# Patient Record
Sex: Male | Born: 1955
Health system: Southern US, Community
[De-identification: ages and names within clinical notes are randomized; demographics above are authoritative.]

## PROBLEM LIST (undated history)

## (undated) DIAGNOSIS — N529 Male erectile dysfunction, unspecified: Secondary | ICD-10-CM

## (undated) DIAGNOSIS — E119 Type 2 diabetes mellitus without complications: Secondary | ICD-10-CM

## (undated) DIAGNOSIS — E785 Hyperlipidemia, unspecified: Secondary | ICD-10-CM

## (undated) DIAGNOSIS — B20 Human immunodeficiency virus [HIV] disease: Secondary | ICD-10-CM

## (undated) HISTORY — DX: Hyperlipidemia, unspecified: E78.5

## (undated) HISTORY — DX: Human immunodeficiency virus (HIV) disease: B20

## (undated) HISTORY — PX: HERNIA REPAIR: SHX51

## (undated) HISTORY — DX: Type 2 diabetes mellitus without complications: E11.9

## (undated) HISTORY — DX: Male erectile dysfunction, unspecified: N52.9

---

## 2014-12-05 ENCOUNTER — Encounter: Payer: Self-pay | Admitting: Family Medicine

## 2014-12-31 ENCOUNTER — Encounter: Payer: Self-pay | Admitting: Family Medicine

## 2014-12-31 LAB — GLUCOSE, POCT (MANUAL RESULT ENTRY): POC Glucose: 160 mg/dl — AB (ref 70–99)

## 2015-07-06 LAB — HM COLONOSCOPY

## 2016-02-29 LAB — LIPID PANEL
Cholesterol: 286 — AB (ref 0–200)
HDL: 33 — AB (ref 35–70)
LDL Cholesterol: 184
TRIGLYCERIDES: 347 — AB (ref 40–160)

## 2016-02-29 LAB — BASIC METABOLIC PANEL
Glucose: 136
Potassium: 5.3 (ref 3.4–5.3)

## 2016-02-29 LAB — CBC AND DIFFERENTIAL
HEMATOCRIT: 43 (ref 41–53)
HEMOGLOBIN: 13.6 (ref 13.5–17.5)

## 2016-02-29 LAB — PSA: PSA: 0.5

## 2016-02-29 LAB — HEMOGLOBIN A1C: HEMOGLOBIN A1C: 7

## 2017-08-02 ENCOUNTER — Encounter: Payer: Self-pay | Admitting: Family Medicine

## 2017-08-25 ENCOUNTER — Ambulatory Visit: Payer: BLUE CROSS/BLUE SHIELD | Admitting: Family Medicine

## 2017-08-25 ENCOUNTER — Encounter: Payer: Self-pay | Admitting: Family Medicine

## 2017-08-25 VITALS — BP 124/74 | HR 62 | Temp 98.1°F | Ht 65.5 in | Wt 172.0 lb

## 2017-08-25 DIAGNOSIS — Z7689 Persons encountering health services in other specified circumstances: Secondary | ICD-10-CM | POA: Diagnosis not present

## 2017-08-25 DIAGNOSIS — E1169 Type 2 diabetes mellitus with other specified complication: Secondary | ICD-10-CM

## 2017-08-25 DIAGNOSIS — R1031 Right lower quadrant pain: Secondary | ICD-10-CM | POA: Diagnosis not present

## 2017-08-25 DIAGNOSIS — E785 Hyperlipidemia, unspecified: Secondary | ICD-10-CM | POA: Diagnosis not present

## 2017-08-25 DIAGNOSIS — N529 Male erectile dysfunction, unspecified: Secondary | ICD-10-CM

## 2017-08-25 DIAGNOSIS — E119 Type 2 diabetes mellitus without complications: Secondary | ICD-10-CM

## 2017-08-25 DIAGNOSIS — Z8719 Personal history of other diseases of the digestive system: Secondary | ICD-10-CM | POA: Diagnosis not present

## 2017-08-25 DIAGNOSIS — Z9889 Other specified postprocedural states: Secondary | ICD-10-CM | POA: Diagnosis not present

## 2017-08-25 DIAGNOSIS — R6882 Decreased libido: Secondary | ICD-10-CM

## 2017-08-25 NOTE — Patient Instructions (Addendum)
It was a pleasure to meet you today! I look forward to partnering with you for your health care needs  Have your eye doctor send me the report  Consider getting some mild compression socks  Please see the referral coordinator on your way out and schedule a 6 month follow up  I will notify you of lab results next week

## 2017-08-25 NOTE — Progress Notes (Signed)
Subjective:    Patient ID: Clinton Watts, male    DOB: 04/14/1956, 62 y.o.   MRN: [id number]  HPI This is a 62 yo male who presents today to establish care. He lives with wife. Has grown children. 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 Enjoys travel.   He moved here from [place] in late 2018. Has history of diabetes on metformin. Checks his blood sugars most days, blood sugars running 90-120s. Never over 200.   Had hernia surgery last year right abdomen. Has occasional pain, a couple times a week. Sharp, doesn't last long. Avoids lifting anything heavy. Has noticed decreased libido. Has noticed difficulty with erection and orgasm. Denies any difficulties prior to his surgery. This has been very concerning for him.   Last CPE- 2016 PSA- in past, does not think it was ever elevated Colonoscopy- 2016- 5 year recall Tdap- unsure Flu- annually Dental- regularl Eye- has an appointment next week Exercise- walks, bicycles, works out at gym    Past Medical History:  Diagnosis Date  . Diabetes mellitus without complication (HCC)   . Hyperlipidemia     Family History  Problem Relation Age of Onset  . Diabetes Mother   . Stomach cancer Sister   . Pancreatic cancer Brother    Social History   Tobacco Use  . Smoking status: Never Smoker  . Smokeless tobacco: Never Used  Substance Use Topics  . Alcohol use: No    Frequency: Never  . Drug use: No      Review of Systems  Constitutional: Negative for fatigue and fever.  Respiratory: Negative for cough, chest tightness and shortness of breath.   Cardiovascular: Negative for chest pain, palpitations and leg swelling.  Gastrointestinal: Positive for abdominal pain (intermittent RLQ at site of hernia repair). Negative for blood in stool, constipation, diarrhea, nausea and vomiting.  Genitourinary: Negative for difficulty urinating, discharge, dysuria and frequency.       Decreased libido, difficulty obtaining and maintaining erection and  reaching orgasm.   Neurological: Negative for headaches.  Psychiatric/Behavioral: Negative for dysphoric mood and sleep disturbance. The patient is not nervous/anxious.        Objective:   Physical Exam Physical Exam  Constitutional: Oriented to person, place, and time. He appears well-developed and well-nourished.  HENT:  Head: Normocephalic and atraumatic.  Eyes: Conjunctivae are normal.  Neck: Normal range of motion. Neck supple.  Cardiovascular: Normal rate, regular rhythm and normal heart sounds.   Pulmonary/Chest: Effort normal and breath sounds normal.  Musculoskeletal: Normal range of motion.  Abdominal- well healed scar RLQ, no abnormality palpated, mild tenderness with palpation.  Neurological: Alert and oriented to person, place, and time.  Skin: Skin is warm and dry.  Psychiatric: Normal mood and affect. Behavior is normal. Judgment and thought content normal.  Vitals reviewed.     BP 124/74   Pulse 62   Temp 98.1 F (36.7 C) (Oral)   Ht 5' 5.5" (1.664 m)   Wt 172 lb (78 kg)   SpO2 98%   BMI 28.19 kg/m      Assessment & Plan:  1. Encounter to establish care - Discussed and encouraged healthy lifestyle choices- adequate sleep, regular exercise, stress management and healthy food choices.  - Obtained records release to request records from previous providers  2. History of hernia repair - Ambulatory referral to General Surgery  3. Right inguinal pain - Ambulatory referral to General Surgery  4. Type 2 diabetes mellitus without complication, without long-term current use of  insulin (HCC) - CBC with Differential - Hemoglobin A1c - Comprehensive metabolic panel - Lipid Panel - TSH  5. Decreased libido - Ambulatory referral to Urology - CBC with Differential - Hemoglobin A1c - Comprehensive metabolic panel - Lipid Panel - TSH  6. Erectile dysfunction, unspecified erectile dysfunction type - Ambulatory referral to Urology - CBC with  Differential - Hemoglobin A1c - Comprehensive metabolic panel - Lipid Panel - TSH  - follow up in 6 months  Olean Reeeborah Martice Doty, FNP-BC  [facility]  08/26/2017 2:16 PM

## 2017-08-26 ENCOUNTER — Encounter: Payer: Self-pay | Admitting: Family Medicine

## 2017-08-26 DIAGNOSIS — E785 Hyperlipidemia, unspecified: Secondary | ICD-10-CM

## 2017-08-26 DIAGNOSIS — Z9889 Other specified postprocedural states: Secondary | ICD-10-CM

## 2017-08-26 DIAGNOSIS — E1169 Type 2 diabetes mellitus with other specified complication: Secondary | ICD-10-CM | POA: Insufficient documentation

## 2017-08-26 DIAGNOSIS — Z8719 Personal history of other diseases of the digestive system: Secondary | ICD-10-CM | POA: Insufficient documentation

## 2017-08-26 DIAGNOSIS — E119 Type 2 diabetes mellitus without complications: Secondary | ICD-10-CM | POA: Insufficient documentation

## 2017-08-26 LAB — CBC WITH DIFFERENTIAL/PLATELET
Basophils Absolute: 38 cells/uL (ref 0–200)
Basophils Relative: 0.5 %
EOS ABS: 188 {cells}/uL (ref 15–500)
Eosinophils Relative: 2.5 %
HCT: 38.3 % — ABNORMAL LOW (ref 38.5–50.0)
Hemoglobin: 12.3 g/dL — ABNORMAL LOW (ref 13.2–17.1)
Lymphs Abs: 2408 cells/uL (ref 850–3900)
MCH: 20.8 pg — AB (ref 27.0–33.0)
MCHC: 32.1 g/dL (ref 32.0–36.0)
MCV: 64.7 fL — AB (ref 80.0–100.0)
MONOS PCT: 7.3 %
MPV: 10.9 fL (ref 7.5–12.5)
NEUTROS ABS: 4320 {cells}/uL (ref 1500–7800)
NEUTROS PCT: 57.6 %
PLATELETS: 190 10*3/uL (ref 140–400)
RBC: 5.92 10*6/uL — ABNORMAL HIGH (ref 4.20–5.80)
RDW: 16.7 % — ABNORMAL HIGH (ref 11.0–15.0)
TOTAL LYMPHOCYTE: 32.1 %
WBC mixed population: 548 cells/uL (ref 200–950)
WBC: 7.5 10*3/uL (ref 3.8–10.8)

## 2017-08-26 LAB — COMPREHENSIVE METABOLIC PANEL
AG RATIO: 1.5 (calc) (ref 1.0–2.5)
ALT: 13 U/L (ref 9–46)
AST: 23 U/L (ref 10–35)
Albumin: 4.5 g/dL (ref 3.6–5.1)
Alkaline phosphatase (APISO): 71 U/L (ref 40–115)
BUN: 19 mg/dL (ref 7–25)
CO2: 28 mmol/L (ref 20–32)
Calcium: 9.4 mg/dL (ref 8.6–10.3)
Chloride: 102 mmol/L (ref 98–110)
Creat: 0.91 mg/dL (ref 0.70–1.25)
GLOBULIN: 3.1 g/dL (ref 1.9–3.7)
GLUCOSE: 101 mg/dL — AB (ref 65–99)
Potassium: 4.2 mmol/L (ref 3.5–5.3)
SODIUM: 139 mmol/L (ref 135–146)
TOTAL PROTEIN: 7.6 g/dL (ref 6.1–8.1)
Total Bilirubin: 0.4 mg/dL (ref 0.2–1.2)

## 2017-08-26 LAB — LIPID PANEL
Cholesterol: 193 mg/dL (ref <200)
HDL: 28 mg/dL — ABNORMAL LOW (ref >40)
LDL Cholesterol (Calc): 129 mg/dL (calc) — ABNORMAL HIGH
NON-HDL CHOLESTEROL (CALC): 165 mg/dL — AB (ref <130)
Total CHOL/HDL Ratio: 6.9 (calc) — ABNORMAL HIGH (ref <5.0)
Triglycerides: 221 mg/dL — ABNORMAL HIGH (ref <150)

## 2017-08-26 LAB — TSH: TSH: 1.4 mIU/L (ref 0.40–4.50)

## 2017-08-26 LAB — HEMOGLOBIN A1C
Hgb A1c MFr Bld: 6.8 % of total Hgb — ABNORMAL HIGH (ref <5.7)
Mean Plasma Glucose: 148 (calc)
eAG (mmol/L): 8.2 (calc)

## 2017-08-31 ENCOUNTER — Telehealth: Payer: Self-pay | Admitting: Family Medicine

## 2017-08-31 NOTE — Telephone Encounter (Signed)
Copied from CRM [id number]. Topic: Quick Communication - See Telephone Encounter >> Aug 31, 2017  4:20 PM Rudi CocoLathan, Natale Thoma M, [place] wrote: CRM for notification. See Telephone encounter for:   08/31/17. Pt. Calling to get lab results from 08-25-17. Pt. Can be reached at [phone]

## 2017-09-01 NOTE — Telephone Encounter (Signed)
Copied from CRM [id number]. Topic: Quick Communication - See Telephone Encounter >> Aug 31, 2017  4:20 PM Rudi CocoLathan, Latoya M, [place] wrote: CRM for notification. See Telephone encounter for:   08/31/17. Pt. Calling to get lab results from 08-25-17. Pt. Can be reached at [phone] >> Sep 01, 2017  3:00 PM Elliot GaultBell, Tiffany M wrote: Relation to pt: self  Call back number: [phone]    Reason for call:  Patient states he missed a call regarding lab results, chart doesn't reflect, please advise

## 2017-09-01 NOTE — Telephone Encounter (Signed)
Called patient to discuss labs.

## 2017-09-04 ENCOUNTER — Encounter: Payer: Self-pay | Admitting: Family Medicine

## 2017-09-04 MED ORDER — ROSUVASTATIN CALCIUM 20 MG PO TABS: 20.0000 mg | ORAL_TABLET | Freq: Every day | ORAL | 1 refills | Status: DC

## 2017-09-18 ENCOUNTER — Telehealth: Payer: Self-pay | Admitting: Family Medicine

## 2017-09-18 NOTE — Telephone Encounter (Signed)
Spoke to pt who states he received colonoscopy at the [facility] in 2016 and believes me may have his results. He will search for them and bring them to office, if so. Pt also states he will be out of town the majority of April and will contact office to schedule f/u appt upon his return

## 2017-09-18 NOTE — Telephone Encounter (Signed)
Please call patient and schedule follow up visit for end of April to recheck labs. I have received some outside records, but they do not include colonoscopy report. Please see if patient remembers where he had procedure and obtain records release.

## 2017-09-19 ENCOUNTER — Telehealth: Payer: Self-pay | Admitting: Family Medicine

## 2017-09-19 NOTE — Telephone Encounter (Signed)
Copied from CRM [id number]. Topic: Quick Communication - See Telephone Encounter >> Sep 19, 2017  3:07 PM Windy KalataMichael, Kendarious Gudino L, NT wrote: CRM for notification. See Telephone encounter for:  09/19/17.  Patient is calling and states [facility] him and states the office needs to contact them in regards to rosuvastatin (CRESTOR) 20 MG tablet before he can get it. Please advise.  CB# [phone]  Reference # [id number]

## 2017-09-19 NOTE — Telephone Encounter (Addendum)
Called [facility] spoke with Raquel pharm tech with ref # [id number]; Needs to verify duplication of pravastatin 40 mg refilled 08/31/17 and now Rosuvastatin 20 mg requested to be filled at [facility]. Pravastatin,simvastatin and rosuvastatin are on current med list. Please see 09/04/17 letter advising pt to stop Atorvastatin which is not on current or hx med list and Simvastatin and start Rosuvastatin. Please advise and then need cb to [facility] with above ref # [id number].

## 2017-09-20 ENCOUNTER — Encounter: Payer: Self-pay | Admitting: Urology

## 2017-09-20 ENCOUNTER — Ambulatory Visit: Payer: BLUE CROSS/BLUE SHIELD | Admitting: Urology

## 2017-09-20 VITALS — BP 171/75 | HR 82 | Ht 65.5 in | Wt 174.4 lb

## 2017-09-20 DIAGNOSIS — N400 Enlarged prostate without lower urinary tract symptoms: Secondary | ICD-10-CM

## 2017-09-20 DIAGNOSIS — N529 Male erectile dysfunction, unspecified: Secondary | ICD-10-CM

## 2017-09-20 DIAGNOSIS — R6882 Decreased libido: Secondary | ICD-10-CM | POA: Diagnosis not present

## 2017-09-20 MED ORDER — SILDENAFIL CITRATE 20 MG PO TABS: ORAL_TABLET | ORAL | 0 refills | Status: DC

## 2017-09-20 NOTE — Telephone Encounter (Signed)
That is correct, he is to stop pravastatin and simvastatin and start rosuvastatin 20 mg. Please call pharmacy to clarify.

## 2017-09-20 NOTE — Progress Notes (Signed)
09/20/2017 11:12 AM   Clinton RichardsNoel Herrig 1956-02-19 [id number]  Referring provider: Emi BelfastGessner, Deborah B, FNP [address] [place], [place] [id number]  Chief Complaint  Patient presents with  . Erectile Dysfunction    HPI: Clinton Watts is a 62 year old male seen in consultation at the request of Olean ReeDeborah Gessner, FNP for evaluation of low libido and erectile dysfunction.  He states after hernia surgery performed in 2017 he had onset of significantly decreased libido and difficulty maintaining an erection.  He typically can get an erection that is firm enough for penetration however he will lose the erection within minutes and prior to ejaculation.  He was having no problems prior to his surgery.  He does have chronic right groin discomfort since the repair and has an appointment pending with general surgery.  He has no bothersome lower urinary tract symptoms.  Denies dysuria or gross hematuria.  Denies flank, abdominal, pelvic or scrotal pain.  There is no pain or curvature with erections.  He denies tiredness or fatigue.   PMH: Past Medical History:  Diagnosis Date  . Diabetes mellitus without complication (HCC)   . Hyperlipidemia     Surgical History: Past Surgical History:  Procedure Laterality Date  . HERNIA REPAIR      Home Medications:  Allergies as of 09/20/2017   No Known Allergies     Medication List        Accurate as of 09/20/17 11:12 AM. Always use your most recent med list.          lisinopril 10 MG tablet Commonly known as:  PRINIVIL,ZESTRIL Take 10 mg by mouth daily.   metFORMIN 500 MG tablet Commonly known as:  GLUCOPHAGE Take 500 mg by mouth 2 (two) times daily with a meal.   rosuvastatin 20 MG tablet Commonly known as:  CRESTOR Take 20 mg by mouth daily.       Allergies: No Known Allergies  Family History: Family History  Problem Relation Age of Onset  . Diabetes Mother   . Stomach cancer Sister   . Pancreatic cancer Brother   . Prostate  cancer Neg Hx   . Bladder Cancer Neg Hx   . Kidney cancer Neg Hx     Social History:  reports that  has never smoked. he has never used smokeless tobacco. He reports that he does not drink alcohol or use drugs.  ROS: UROLOGY Frequent Urination?: Yes Hard to postpone urination?: No Burning/pain with urination?: No Get up at night to urinate?: Yes Leakage of urine?: No Urine stream starts and stops?: No Trouble starting stream?: No Do you have to strain to urinate?: No Blood in urine?: No Urinary tract infection?: No Sexually transmitted disease?: No Injury to kidneys or bladder?: No Painful intercourse?: No Weak stream?: Yes Erection problems?: Yes Penile pain?: Yes  Gastrointestinal Nausea?: No Vomiting?: No Indigestion/heartburn?: No Diarrhea?: No Constipation?: No  Constitutional Fever: No Night sweats?: No Weight loss?: No Fatigue?: No  Skin Skin rash/lesions?: No Itching?: No  Eyes Blurred vision?: No Double vision?: No  Ears/Nose/Throat Sore throat?: No Sinus problems?: Yes  Hematologic/Lymphatic Swollen glands?: No Easy bruising?: No  Cardiovascular Leg swelling?: No Chest pain?: No  Respiratory Cough?: No Shortness of breath?: No  Endocrine Excessive thirst?: No  Musculoskeletal Back pain?: No Joint pain?: No  Neurological Headaches?: No Dizziness?: No  Psychologic Depression?: No Anxiety?: No  Physical Exam: BP (!) 171/75 (BP Location: Right Arm, Patient Position: Sitting, Cuff Size: Normal)   Pulse 82  Ht 5' 5.5" (1.664 m)   Wt 174 lb 6.4 oz (79.1 kg)   BMI 28.58 kg/m   Constitutional:  Alert and oriented, No acute distress. HEENT: [place] AT, moist mucus membranes.  Trachea midline, no masses. Cardiovascular: No clubbing, cyanosis, or edema. Respiratory: Normal respiratory effort, no increased work of breathing. GI: Abdomen is soft, nontender, nondistended, no abdominal masses GU: No CVA tenderness.  Penis circumcised  without lesions.  No palpable plaques.  Testes descended bilaterally without masses or tenderness with estimated volume of 15 cc.  Prostate 40 g, smooth without nodules. Lymph: No cervical or inguinal lymphadenopathy. Skin: No rashes, bruises or suspicious lesions. Neurologic: Grossly intact, no focal deficits, moving all 4 extremities. Psychiatric: Normal mood and affect.  Laboratory Data: Lab Results  Component Value Date   WBC 7.5 08/25/2017   HGB 12.3 (L) 08/25/2017   HCT 38.3 (L) 08/25/2017   MCV 64.7 (L) 08/25/2017   PLT 190 08/25/2017    Lab Results  Component Value Date   CREATININE 0.91 08/25/2017    Lab Results  Component Value Date   HGBA1C 6.8 (H) 08/25/2017     Assessment & Plan:   62 year old male with low libido and erectile dysfunction.  He was informed that his hernia surgery would not have been a direct cause of this problem as there are no nerves in this area that would relate to erections.  It is possible this may have been and directly related to the problem with postoperative pain and chronic groin pain.  I recommended an a.m. testosterone level, LH and PSA. If low he will need a repeat testosterone level.  He was interested in a trial of generic sildenafil and an Rx was sent.   [name], MD  [facility] [address], [address] [place], [place] [address] [phone]

## 2017-09-21 ENCOUNTER — Encounter: Payer: Self-pay | Admitting: Family Medicine

## 2017-09-21 NOTE — Telephone Encounter (Signed)
Patient calling because he still has not received rosuvastatin and he says [facility] told him they are open 9 to 5. He is concerned because he has already stopped taking the other two medications, aforementioned. Patient would like a call back to discuss.

## 2017-09-21 NOTE — Telephone Encounter (Signed)
Attempted to [facility]; currently closed. Will attempt again later

## 2017-09-22 NOTE — Telephone Encounter (Signed)
Spoke to pharmacist, Raeann and advised per Ball CorporationDGessner. Also spoke to pt and advised corrections have been made. Pt is going to contact pharmacy and request medication be sent overnight, as Raeann stated the request had to come from the pt since there is an additional charge. [facility] and notify office back if he is needing meds sent to local pharmacy

## 2017-09-26 DIAGNOSIS — E119 Type 2 diabetes mellitus without complications: Secondary | ICD-10-CM | POA: Diagnosis not present

## 2017-09-27 ENCOUNTER — Other Ambulatory Visit: Payer: Self-pay | Admitting: *Deleted

## 2017-09-27 ENCOUNTER — Other Ambulatory Visit (HOSPITAL_COMMUNITY)
Admission: RE | Admit: 2017-09-27 | Discharge: 2017-09-27 | Disposition: A | Discharge status: Home / Self Care | Payer: BLUE CROSS/BLUE SHIELD | Source: Ambulatory Visit | Attending: Infectious Diseases | Admitting: Infectious Diseases

## 2017-09-27 ENCOUNTER — Other Ambulatory Visit: Payer: BLUE CROSS/BLUE SHIELD

## 2017-09-27 ENCOUNTER — Ambulatory Visit: Payer: BLUE CROSS/BLUE SHIELD

## 2017-09-27 DIAGNOSIS — Z113 Encounter for screening for infections with a predominantly sexual mode of transmission: Secondary | ICD-10-CM

## 2017-09-27 DIAGNOSIS — Z79899 Other long term (current) drug therapy: Secondary | ICD-10-CM

## 2017-09-27 DIAGNOSIS — B20 Human immunodeficiency virus [HIV] disease: Secondary | ICD-10-CM | POA: Diagnosis not present

## 2017-09-27 LAB — HM DIABETES EYE EXAM

## 2017-09-28 LAB — URINE CYTOLOGY ANCILLARY ONLY
Chlamydia: NEGATIVE
Neisseria Gonorrhea: NEGATIVE

## 2017-09-28 LAB — URINALYSIS
BILIRUBIN URINE: NEGATIVE
GLUCOSE, UA: NEGATIVE
Hgb urine dipstick: NEGATIVE
KETONES UR: NEGATIVE
Leukocytes, UA: NEGATIVE
Nitrite: NEGATIVE
PH: 6 (ref 5.0–8.0)
Protein, ur: NEGATIVE
SPECIFIC GRAVITY, URINE: 1.011 (ref 1.001–1.03)

## 2017-09-29 LAB — QUANTIFERON-TB GOLD PLUS
Mitogen-NIL: >10 IU/mL
NIL: 0.14 [IU]/mL
QuantiFERON-TB Gold Plus: NEGATIVE
TB1-NIL: 0.02 IU/mL
TB2-NIL: 0.01 [IU]/mL

## 2017-09-29 LAB — T-HELPER CELL (CD4) - (RCID CLINIC ONLY)
CD4 T CELL ABS: 450 /uL (ref 400–2700)
CD4 T CELL HELPER: 20 % — AB (ref 33–55)

## 2017-09-30 LAB — COMPLETE METABOLIC PANEL WITH GFR
AG RATIO: 1.6 (calc) (ref 1.0–2.5)
ALBUMIN MSPROF: 4.7 g/dL (ref 3.6–5.1)
ALT: 15 U/L (ref 9–46)
AST: 25 U/L (ref 10–35)
Alkaline phosphatase (APISO): 73 U/L (ref 40–115)
BILIRUBIN TOTAL: 0.3 mg/dL (ref 0.2–1.2)
BUN: 17 mg/dL (ref 7–25)
CHLORIDE: 101 mmol/L (ref 98–110)
CO2: 29 mmol/L (ref 20–32)
Calcium: 9.7 mg/dL (ref 8.6–10.3)
Creat: 1 mg/dL (ref 0.70–1.25)
GFR, EST AFRICAN AMERICAN: 92 mL/min/{1.73_m2} (ref >=60)
GFR, Est Non African American: 80 mL/min/{1.73_m2} (ref >=60)
Globulin: 2.9 g/dL (calc) (ref 1.9–3.7)
Glucose, Bld: 134 mg/dL — ABNORMAL HIGH (ref 65–99)
POTASSIUM: 4.3 mmol/L (ref 3.5–5.3)
Sodium: 141 mmol/L (ref 135–146)
TOTAL PROTEIN: 7.6 g/dL (ref 6.1–8.1)

## 2017-09-30 LAB — CBC WITH DIFFERENTIAL/PLATELET
BASOS PCT: 0.4 %
Basophils Absolute: 30 cells/uL (ref 0–200)
Eosinophils Absolute: 167 cells/uL (ref 15–500)
Eosinophils Relative: 2.2 %
HEMATOCRIT: 38.4 % — AB (ref 38.5–50.0)
HEMOGLOBIN: 12.5 g/dL — AB (ref 13.2–17.1)
LYMPHS ABS: 2280 {cells}/uL (ref 850–3900)
MCH: 21 pg — ABNORMAL LOW (ref 27.0–33.0)
MCHC: 32.6 g/dL (ref 32.0–36.0)
MCV: 64.4 fL — ABNORMAL LOW (ref 80.0–100.0)
MPV: 10.5 fL (ref 7.5–12.5)
Monocytes Relative: 5.9 %
NEUTROS PCT: 61.5 %
Neutro Abs: 4674 cells/uL (ref 1500–7800)
Platelets: 195 10*3/uL (ref 140–400)
RBC: 5.96 10*6/uL — AB (ref 4.20–5.80)
RDW: 16.4 % — AB (ref 11.0–15.0)
TOTAL LYMPHOCYTE: 30 %
WBC: 7.6 10*3/uL (ref 3.8–10.8)
WBCMIX: 448 {cells}/uL (ref 200–950)

## 2017-09-30 LAB — LIPID PANEL
CHOLESTEROL: 211 mg/dL — AB (ref <200)
HDL: 27 mg/dL — ABNORMAL LOW (ref >40)
LDL CHOLESTEROL (CALC): 135 mg/dL — AB
Non-HDL Cholesterol (Calc): 184 mg/dL (calc) — ABNORMAL HIGH (ref <130)
TRIGLYCERIDES: 344 mg/dL — AB (ref <150)
Total CHOL/HDL Ratio: 7.8 (calc) — ABNORMAL HIGH (ref <5.0)

## 2017-09-30 LAB — RPR: RPR: NONREACTIVE

## 2017-09-30 LAB — HEPATITIS A ANTIBODY, TOTAL: Hepatitis A AB,Total: REACTIVE — AB

## 2017-09-30 LAB — HEPATITIS B CORE ANTIBODY, TOTAL: Hep B Core Total Ab: NONREACTIVE

## 2017-09-30 LAB — HEPATITIS C ANTIBODY
Hepatitis C Ab: NONREACTIVE
SIGNAL TO CUT-OFF: 0.37 (ref <1.00)

## 2017-09-30 LAB — HLA B*5701: HLA-B*5701 w/rflx HLA-B High: NEGATIVE

## 2017-09-30 LAB — HEPATITIS B SURFACE ANTIBODY,QUALITATIVE: Hep B S Ab: REACTIVE — AB

## 2017-09-30 LAB — HIV-1 RNA ULTRAQUANT REFLEX TO GENTYP+
HIV 1 RNA Quant: <20 Copies/mL
HIV-1 RNA Quant, Log: <1.3 Log cps/mL

## 2017-09-30 LAB — HEPATITIS B SURFACE ANTIGEN: Hepatitis B Surface Ag: NONREACTIVE

## 2017-10-12 ENCOUNTER — Ambulatory Visit (INDEPENDENT_AMBULATORY_CARE_PROVIDER_SITE_OTHER): Payer: BLUE CROSS/BLUE SHIELD | Admitting: Infectious Diseases

## 2017-10-12 ENCOUNTER — Ambulatory Visit (INDEPENDENT_AMBULATORY_CARE_PROVIDER_SITE_OTHER): Payer: BLUE CROSS/BLUE SHIELD | Admitting: Pharmacist

## 2017-10-12 ENCOUNTER — Encounter: Payer: Self-pay | Admitting: Infectious Diseases

## 2017-10-12 ENCOUNTER — Encounter: Payer: Self-pay | Admitting: Family Medicine

## 2017-10-12 VITALS — BP 150/71 | HR 75 | Temp 98.0°F | Ht 67.0 in | Wt 175.0 lb

## 2017-10-12 DIAGNOSIS — B20 Human immunodeficiency virus [HIV] disease: Secondary | ICD-10-CM | POA: Diagnosis not present

## 2017-10-12 DIAGNOSIS — E1169 Type 2 diabetes mellitus with other specified complication: Secondary | ICD-10-CM

## 2017-10-12 DIAGNOSIS — Z21 Asymptomatic human immunodeficiency virus [HIV] infection status: Secondary | ICD-10-CM | POA: Diagnosis not present

## 2017-10-12 DIAGNOSIS — E785 Hyperlipidemia, unspecified: Secondary | ICD-10-CM

## 2017-10-12 DIAGNOSIS — Z0000 Encounter for general adult medical examination without abnormal findings: Secondary | ICD-10-CM

## 2017-10-12 DIAGNOSIS — N529 Male erectile dysfunction, unspecified: Secondary | ICD-10-CM | POA: Diagnosis not present

## 2017-10-12 HISTORY — DX: Human immunodeficiency virus (HIV) disease: B20

## 2017-10-12 HISTORY — DX: Asymptomatic human immunodeficiency virus (hiv) infection status: Z21

## 2017-10-12 MED ORDER — BICTEGRAVIR-EMTRICITAB-TENOFOV 50-200-25 MG PO TABS: 1.0000 | ORAL_TABLET | Freq: Every day | ORAL | 1 refills | Status: DC

## 2017-10-12 MED ORDER — BICTEGRAVIR-EMTRICITAB-TENOFOV 50-200-25 MG PO TABS: 1.0000 | ORAL_TABLET | Freq: Every day | ORAL | 6 refills | Status: DC

## 2017-10-12 NOTE — Assessment & Plan Note (Signed)
Agree with continued aggressive statin treatment to lower CV risk in this hypertensive, diabetic HIV(+) patient.

## 2017-10-12 NOTE — Assessment & Plan Note (Signed)
Up to date on recommended vaccines. Booster with pneumovax after 62 yo.

## 2017-10-12 NOTE — Patient Instructions (Addendum)
Stop Atripla when you are done with your last bottle.   Biktarvy is the pill for your HIV virus - this will need to be taken once a day around the same time.  - Common side effects for a short time frame usually include headaches, nausea and diarrhea - OK to take over the counter tylenol for headaches and imodium for diarrhea - Try taking with food if you are nauseated  - Separate all vitamins, antacids and iron supplements by this pill by 6 hours before or after.   Will have you back in 3 months to see Judeth CornfieldStephanie again.

## 2017-10-12 NOTE — Assessment & Plan Note (Signed)
Drug induced vs low testosterone vs metabolic disease?  His PCP is planning on checking testosterone.  Have advised to trial the sildenafil Debbie gave him to see if he gets good effect from this. No interaction concern with his HIV medications.

## 2017-10-12 NOTE — Progress Notes (Signed)
[facility] Visit  HPI: Clinton Watts is a 62 y.o. male who presents to the [facility] clinic today [place] [facility]  Patient Active Problem List   Diagnosis Date Noted  . HIV (human immunodeficiency virus infection) (HCC) 10/12/2017  . History of hernia repair 08/26/2017  . Type 2 diabetes mellitus without complication, without long-term current use of insulin (HCC) 08/26/2017  . Hyperlipidemia associated with type 2 diabetes mellitus (HCC) 08/26/2017    Patient's Medications  New Prescriptions   BICTEGRAVIR-EMTRICITABINE-TENOFOVIR AF (BIKTARVY) 50-200-25 MG TABS TABLET    Take 1 tablet by mouth daily.  Previous Medications   LISINOPRIL (PRINIVIL,ZESTRIL) 10 MG TABLET    Take 10 mg by mouth daily.   METFORMIN (GLUCOPHAGE) 500 MG TABLET    Take 500 mg by mouth 2 (two) times daily with a meal.   ROSUVASTATIN (CRESTOR) 20 MG TABLET    Take 20 mg by mouth daily.   SILDENAFIL (REVATIO) 20 MG TABLET    2-5 tabs 1 hour prior to intercourse  Modified Medications   No medications on file  Discontinued Medications   EFAVIRENZ-EMTRICITABINE-TENOFOVIR (ATRIPLA) 600-200-300 MG TABLET    Take 1 tablet by mouth at bedtime.    Allergies: No Known Allergies  Past Medical History: Past Medical History:  Diagnosis Date  . Diabetes mellitus without complication (HCC)   . HIV (human immunodeficiency virus infection) (HCC) 10/12/2017  . Hyperlipidemia     Social History: Social History   Socioeconomic History  . Marital status: Married    Spouse name: Not on file  . Number of children: Not on file  . Years of education: Not on file  . Highest education level: Not on file  Occupational History  . Not on file  Social Needs  . Financial resource strain: Not on file  . Food insecurity:    Worry: Not on file    Inability: Not on file  . Transportation needs:    Medical: Not on file   Non-medical: Not on file  Tobacco Use  . Smoking status: Never Smoker  . Smokeless tobacco: Never Used  Substance and Sexual Activity  . Alcohol use: No    Frequency: Never  . Drug use: No  . Sexual activity: Yes  Lifestyle  . Physical activity:    Days per week: Not on file    Minutes per session: Not on file  . Stress: Not on file  Relationships  . Social connections:    Talks on phone: Not on file    Gets together: Not on file    Attends religious service: Not on file    Active member of club or organization: Not on file    Attends meetings of clubs or organizations: Not on file    Relationship status: Not on file  Other Topics Concern  . Not on file  Social History Narrative   Patient lives with his wife. Has 2 grown children, several grandchildren.    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     Labs: HIV 1 RNA Quant (Copies/mL)  Date Value  09/27/2017 <20   CD4 T Cell Abs (/uL)  Date Value  09/27/2017 450   Hep B S Ab (no units)  Date Value  09/27/2017 REACTIVE (A)   Hepatitis B Surface Ag (no units)  Date Value  09/27/2017 NON-REACTIVE    Lipids:    Component Value Date/Time  CHOL 211 (H) 09/27/2017 1443   TRIG 344 (H) 09/27/2017 1443   HDL 27 (L) 09/27/2017 1443   CHOLHDL 7.8 (H) 09/27/2017 1443   LDLCALC 135 (H) 09/27/2017 1443    Current HIV Regimen: Atripla  Assessment: Berl is here to initiate care with Judeth Cornfield for his HIV infection.  He recently moved here from [place] to be closer to family.  He is doing well on Atripla with an undetectable viral load and no issues.  Judeth Cornfield discussed the issues surrounding Atripla and TDF-based regimens and he is willing to switch.  I discussed Biktarvy with him today. I showed him the different medications in Biktarvy and the difference in the TDF-based regimen vs TAF-based ones. Also explained how to take it and that he could continue taking it before bedtime if he would like.  Discussed possible  side effects such as headaches and nausea with him.  He is willing to try.  [facility] He requested a 90 day fill, so that is what I sent in.  I will have him f/u in ~4 weeks.  He is going to visit family in [place] next month so he will call me to schedule a f/u appointment. Gave him a new co-pay card (he had an old one) and my card, as well, and told him to call me with any issues.  Plan: - Stop Atripla - Start Biktarvy PO once daily - F/u with me again in 4-6 weeks  Patrese Neal L. Rasheeda Mulvehill, PharmD, AAHIVP, CPP Infectious Diseases Clinical Pharmacist [facility] 10/12/2017, 3:04 PM

## 2017-10-12 NOTE — Progress Notes (Signed)
Name: Clinton Watts  DOB: 10/31/1955 MRN: [id number] PCP: Emi BelfastGessner, Deborah B, FNP   Patient Active Problem List   Diagnosis Date Noted  . HIV (human immunodeficiency virus infection) (HCC) 10/12/2017  . Healthcare maintenance 10/12/2017  . Erectile dysfunction 10/12/2017  . History of hernia repair 08/26/2017  . Type 2 diabetes mellitus without complication, without long-term current use of insulin (HCC) 08/26/2017  . Hyperlipidemia associated with type 2 diabetes mellitus (HCC) 08/26/2017     Brief Narrative:  Clinton Watts is a 62 y.o. AA male with HIV infection. Originally diagnosed with HIV in 1992. [place]. HIV Risk: heterosexual contact. [facility] [place] [phone]). Hx OIs: none known   Previous Regimens:  Combivir + Crixivan . Atripla --> well controlled and undetectable for years  Genotypes: . None on file or from previous provider   Subjective:  Clinton Watts is here today to establish his HIV care. He is currently taking Atripla and has tolerated this well for many years and has no concerns over medication side effect. He has been told in the past it may be best to switch his ART but he is not certain as to why. Previous VL's have been undetectable and CD4 with full immune reconstitution 500 - 650 range. Reports no complaints today suggestive of associated opportunistic infection or advancing HIV disease such as fevers, night sweats, weight loss, anorexia, cough, SOB, nausea, vomiting, diarrhea, headache, sensory changes, lymphadenopathy or oral thrush.    Other chronic medical conditions include type 2 diabetes for which he takes metformin; hypertension for which he takes lisinopril; hyperlipidemia for which he takes crestor. He has a history of chronic anemia that he tells me he was previously recommended iron supplement but moved before prescription was given.   He receives routine preventative care and chronic disease management  through his PCP. He is up to date on all recommended vaccines and screenings. Colonoscopy 2016 (normal). Only concern today is erectile dysfunction which is a new problem for him. Has been rx'd   Review of Systems  Constitutional: Negative for chills, fever, malaise/fatigue and weight loss.  HENT: Negative for sore throat.        No dental problems  Respiratory: Negative for cough and sputum production.   Cardiovascular: Negative for chest pain and leg swelling.  Gastrointestinal: Negative for abdominal pain, diarrhea and vomiting.  Genitourinary: Negative for dysuria and flank pain.       ED  Musculoskeletal: Negative for joint pain, myalgias and neck pain.  Skin: Negative for rash.  Neurological: Negative for dizziness, tingling and headaches.  Psychiatric/Behavioral: Negative for depression and substance abuse. The patient is not nervous/anxious and does not have insomnia.     Past Medical History:  Diagnosis Date  . Diabetes mellitus without complication (HCC)   . HIV (human immunodeficiency virus infection) (HCC) 10/12/2017  . Hyperlipidemia     Outpatient Medications Prior to Visit  Medication Sig Dispense Refill  . metFORMIN (GLUCOPHAGE) 500 MG tablet Take 500 mg by mouth 2 (two) times daily with a meal.    . sildenafil (REVATIO) 20 MG tablet 2-5 tabs 1 hour prior to intercourse 30 tablet 0  . efavirenz-emtricitabine-tenofovir (ATRIPLA) 600-200-300 MG tablet Take 1 tablet by mouth at bedtime.    Marland Kitchen. lisinopril (PRINIVIL,ZESTRIL) 10 MG tablet Take 10 mg by mouth daily.    . rosuvastatin (CRESTOR) 20 MG tablet Take 20 mg by mouth daily.     No facility-administered medications prior to  visit.      No Known Allergies  Social History   Tobacco Use  . Smoking status: Never Smoker  . Smokeless tobacco: Never Used  Substance Use Topics  . Alcohol use: No    Frequency: Never  . Drug use: No    Family History  Problem Relation Age of Onset  . Diabetes Mother   . Stomach  cancer Sister   . Pancreatic cancer Brother   . Prostate cancer Neg Hx   . Bladder Cancer Neg Hx   . Kidney cancer Neg Hx     Social History   Substance and Sexual Activity  Sexual Activity Yes     Objective:   Vitals:   10/12/17 1340  BP: (!) 150/71  Pulse: 75  Temp: 98 F (36.7 C)  TempSrc: Oral  Weight: 175 lb (79.4 kg)  Height: 5\' 7"  (1.702 m)   Body mass index is 27.41 kg/m.  Physical Exam  Constitutional: He is oriented to person, place, and time. Vital signs are normal. He appears well-developed and well-nourished.  HENT:  Mouth/Throat: No oral lesions. Normal dentition. No dental caries.  Eyes: No scleral icterus.  Cardiovascular: Normal rate, regular rhythm and normal heart sounds.  Pulmonary/Chest: Effort normal and breath sounds normal.  Abdominal: Soft. He exhibits no distension. There is no tenderness.  Lymphadenopathy:    He has no cervical adenopathy.  Neurological: He is alert and oriented to person, place, and time.  Skin: Skin is warm and dry. No rash noted.  Psychiatric: He has a normal mood and affect. His behavior is normal. Judgment and thought content normal.  Vitals reviewed.   Lab Results Lab Results  Component Value Date   WBC 7.6 09/27/2017   HGB 12.5 (L) 09/27/2017   HCT 38.4 (L) 09/27/2017   MCV 64.4 (L) 09/27/2017   PLT 195 09/27/2017    Lab Results  Component Value Date   CREATININE 1.00 09/27/2017   BUN 17 09/27/2017   NA 141 09/27/2017   K 4.3 09/27/2017   CL 101 09/27/2017   CO2 29 09/27/2017    Lab Results  Component Value Date   ALT 15 09/27/2017   AST 25 09/27/2017   BILITOT 0.3 09/27/2017    Lab Results  Component Value Date   CHOL 211 (H) 09/27/2017   HDL 27 (L) 09/27/2017   LDLCALC 135 (H) 09/27/2017   TRIG 344 (H) 09/27/2017   CHOLHDL 7.8 (H) 09/27/2017   HIV 1 RNA Quant (Copies/mL)  Date Value  09/27/2017 <20   CD4 T Cell Abs (/uL)  Date Value  09/27/2017 450   Lab Results  Component Value  Date   HGBA1C 6.8 (H) 08/25/2017    Assessment & Plan:   Problem List Items Addressed This Visit      Endocrine   Hyperlipidemia associated with type 2 diabetes mellitus (HCC)    Agree with continued aggressive statin treatment to lower CV risk in this hypertensive, diabetic HIV(+) patient.         Genitourinary   Erectile dysfunction    Drug induced vs low testosterone vs metabolic disease?  His PCP is planning on checking testosterone.  Have advised to trial the sildenafil Debbie gave him to see if he gets good effect from this. No interaction concern with his HIV medications.         Other   HIV (human immunodeficiency virus infection) (HCC)    Has been well controlled on Atripla with serial undetectable viral loads.  No genotype included with records but from our discussion it seems he has never been on integrase based regimen. We discussed the benefit of switching to newer/safer agent today for kidney and bone protection tenofovir AF offers vs. tenofovir DF. He would like to consider this change but finish his present supply of Atripla. I had him meet with our pharmacist Cassie today and provided with copay assist. I discussed with him the link with working with his PCP to keep good control over diabetes, high blood pressure and high cholesterol to reduce CV risk considering his HIV is a big risk factor for these events.   He will return to see me in 3 months or sooner if needed.       Healthcare maintenance    Up to date on recommended vaccines. Booster with pneumovax after 62 yo.         He will return in 3 months to see me.   Rexene Alberts, MSN, NP-C [facility] Pager: [phone] Office: [phone]  10/12/17  3:37 PM

## 2017-10-12 NOTE — Assessment & Plan Note (Addendum)
Has been well controlled on Atripla with serial undetectable viral loads. No genotype included with records but from our discussion it seems he has never been on integrase based regimen. We discussed the benefit of switching to newer/safer agent today for kidney and bone protection tenofovir AF offers vs. tenofovir DF. He would like to consider this change but finish his present supply of Atripla. I had him meet with our pharmacist Cassie today and provided with copay assist. I discussed with him the link with working with his PCP to keep good control over diabetes, high blood pressure and high cholesterol to reduce CV risk considering his HIV is a big risk factor for these events.   He will return to see me in 3 months or sooner if needed.

## 2017-10-13 ENCOUNTER — Other Ambulatory Visit: Payer: BLUE CROSS/BLUE SHIELD

## 2017-10-16 ENCOUNTER — Telehealth: Payer: Self-pay | Admitting: Pharmacist

## 2017-10-16 NOTE — Telephone Encounter (Signed)
Thank you so very much for helping him get his medications.

## 2017-10-16 NOTE — Telephone Encounter (Signed)
Clinton Watts called and said that his Biktarvy prescriptions was $90 at the pharmacy and he could not afford it.  I called [facility] and they had a co-pay card on file for him but it wasn't working for the [employer].  Activated a new one for him and gave [facility] the information.  I also had Clinton Watts, Charity fundraiserN, mail him the co-pay at his request. Patient's co-pay is now $0. He will pick it up today.

## 2017-10-18 ENCOUNTER — Encounter: Payer: Self-pay | Admitting: Urology

## 2017-10-18 ENCOUNTER — Other Ambulatory Visit: Payer: BLUE CROSS/BLUE SHIELD

## 2017-10-18 ENCOUNTER — Ambulatory Visit: Payer: BLUE CROSS/BLUE SHIELD | Admitting: Primary Care

## 2017-10-18 ENCOUNTER — Encounter: Payer: Self-pay | Admitting: Primary Care

## 2017-10-18 ENCOUNTER — Other Ambulatory Visit: Payer: Self-pay | Admitting: Primary Care

## 2017-10-18 ENCOUNTER — Ambulatory Visit: Payer: Self-pay

## 2017-10-18 ENCOUNTER — Ambulatory Visit
Admission: RE | Admit: 2017-10-18 | Discharge: 2017-10-18 | Disposition: A | Discharge status: Home / Self Care | Payer: BLUE CROSS/BLUE SHIELD | Source: Ambulatory Visit | Attending: Primary Care | Admitting: Primary Care

## 2017-10-18 VITALS — BP 130/70 | HR 104 | Temp 102.2°F | Ht 67.0 in | Wt 174.2 lb

## 2017-10-18 DIAGNOSIS — K639 Disease of intestine, unspecified: Secondary | ICD-10-CM | POA: Insufficient documentation

## 2017-10-18 DIAGNOSIS — R1084 Generalized abdominal pain: Secondary | ICD-10-CM | POA: Insufficient documentation

## 2017-10-18 DIAGNOSIS — K529 Noninfective gastroenteritis and colitis, unspecified: Secondary | ICD-10-CM

## 2017-10-18 DIAGNOSIS — R109 Unspecified abdominal pain: Secondary | ICD-10-CM | POA: Diagnosis not present

## 2017-10-18 LAB — COMPREHENSIVE METABOLIC PANEL
ALBUMIN: 3.7 g/dL (ref 3.5–5.2)
ALT: 10 U/L (ref 0–53)
AST: 21 U/L (ref 0–37)
Alkaline Phosphatase: 56 U/L (ref 39–117)
BUN: 14 mg/dL (ref 6–23)
CALCIUM: 8.7 mg/dL (ref 8.4–10.5)
CO2: 27 meq/L (ref 19–32)
CREATININE: 1.07 mg/dL (ref 0.40–1.50)
Chloride: 95 mEq/L — ABNORMAL LOW (ref 96–112)
GFR: 89.48 mL/min (ref >60.00)
Glucose, Bld: 172 mg/dL — ABNORMAL HIGH (ref 70–99)
Potassium: 4.3 mEq/L (ref 3.5–5.1)
Sodium: 128 mEq/L — ABNORMAL LOW (ref 135–145)
Total Bilirubin: 0.4 mg/dL (ref 0.2–1.2)
Total Protein: 7 g/dL (ref 6.0–8.3)

## 2017-10-18 LAB — CBC WITH DIFFERENTIAL/PLATELET
BASOS ABS: 0 10*3/uL (ref 0.0–0.1)
Basophils Relative: 0.5 % (ref 0.0–3.0)
EOS ABS: 0.1 10*3/uL (ref 0.0–0.7)
Eosinophils Relative: 0.9 % (ref 0.0–5.0)
HEMATOCRIT: 38.9 % — AB (ref 39.0–52.0)
HEMOGLOBIN: 12.7 g/dL — AB (ref 13.0–17.0)
LYMPHS PCT: 21 % (ref 12.0–46.0)
Lymphs Abs: 1.2 10*3/uL (ref 0.7–4.0)
MCHC: 32.5 g/dL (ref 30.0–36.0)
MCV: 64.4 fl — ABNORMAL LOW (ref 78.0–100.0)
Monocytes Absolute: 1.3 10*3/uL — ABNORMAL HIGH (ref 0.1–1.0)
Monocytes Relative: 22.1 % — ABNORMAL HIGH (ref 3.0–12.0)
Neutro Abs: 3.2 10*3/uL (ref 1.4–7.7)
Neutrophils Relative %: 55.5 % (ref 43.0–77.0)
Platelets: 164 10*3/uL (ref 150.0–400.0)
RBC: 6.05 Mil/uL — ABNORMAL HIGH (ref 4.22–5.81)
RDW: 15.1 % (ref 11.5–15.5)
WBC: 5.7 10*3/uL (ref 4.0–10.5)

## 2017-10-18 LAB — LIPASE: LIPASE: 9 U/L — AB (ref 11.0–59.0)

## 2017-10-18 LAB — GAMMA GT: GGT: 28 U/L (ref 7–51)

## 2017-10-18 MED ORDER — IOHEXOL 300 MG/ML  SOLN
100.0000 mL | Freq: Once | INTRAMUSCULAR | Status: AC | PRN
  Administered 2017-10-18: 100 mL via INTRAVENOUS

## 2017-10-18 MED ORDER — CIPROFLOXACIN HCL 500 MG PO TABS: 500.0000 mg | ORAL_TABLET | Freq: Two times a day (BID) | ORAL | 0 refills | Status: DC

## 2017-10-18 NOTE — Progress Notes (Signed)
Subjective:    Patient ID: Clinton Watts, male    DOB: 23-Apr-1956, 62 y.o.   MRN: [id number]  HPI  Mr. Shirlee LatchMcLean is a 62 year old male with a history of type 2 diabetes, hyperlipidemia, HIV who presents today with a chief complaint of abdominal pain.  His abdominal pain began three days ago and is located to the generalized abdominal. He also reports non bloody watery diarrhea that began three days ago and is occurring every 1-2 hours. He's had 4 episodes of diarrhea today. He's also nauseated, denies vomiting. He's been drinking water, he's not eaten anything since yesterday. He's feeling very weak.  He's been taking Imodium-D and Pepto Bismol without improvement. He's been running fevers, 103 last night, today in the clinic 102.2. He took Alka-Seltzer last night, no Ibuprofen or Tylenol. He denies vomiting, bloody stools, near syncope, sick contacts.   Review of Systems  Constitutional: Positive for chills, fatigue and fever.  HENT: Negative for congestion.   Respiratory: Negative for cough.   Gastrointestinal: Positive for abdominal distention, abdominal pain, diarrhea and nausea. Negative for blood in stool and vomiting.  Neurological: Positive for weakness.       Past Medical History:  Diagnosis Date  . Diabetes mellitus without complication (HCC)   . HIV (human immunodeficiency virus infection) (HCC) 10/12/2017  . Hyperlipidemia      Social History   Socioeconomic History  . Marital status: Married    Spouse name: Not on file  . Number of children: Not on file  . Years of education: Not on file  . Highest education level: Not on file  Occupational History  . Not on file  Social Needs  . Financial resource strain: Not on file  . Food insecurity:    Worry: Not on file    Inability: Not on file  . Transportation needs:    Medical: Not on file    Non-medical: Not on file  Tobacco Use  . Smoking status: Never Smoker  . Smokeless tobacco: Never Used  Substance and Sexual  Activity  . Alcohol use: No    Frequency: Never  . Drug use: No  . Sexual activity: Yes  Lifestyle  . Physical activity:    Days per week: Not on file    Minutes per session: Not on file  . Stress: Not on file  Relationships  . Social connections:    Talks on phone: Not on file    Gets together: Not on file    Attends religious service: Not on file    Active member of club or organization: Not on file    Attends meetings of clubs or organizations: Not on file    Relationship status: Not on file  . Intimate partner violence:    Fear of current or ex partner: Not on file    Emotionally abused: Not on file    Physically abused: Not on file    Forced sexual activity: Not on file  Other Topics Concern  . Not on file  Social History Narrative   Patient lives with his wife. Has 2 grown children, several grandchildren.    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     Past Surgical History:  Procedure Laterality Date  . HERNIA REPAIR      Family History  Problem Relation Age of Onset  . Diabetes Mother   . Stomach cancer Sister   . Pancreatic cancer Brother   . Prostate cancer Neg Hx   .  Bladder Cancer Neg Hx   . Kidney cancer Neg Hx     No Known Allergies  Current Outpatient Medications on File Prior to Visit  Medication Sig Dispense Refill  . bictegravir-emtricitabine-tenofovir AF (BIKTARVY) 50-200-25 MG TABS tablet Take 1 tablet by mouth daily. 90 tablet 1  . metFORMIN (GLUCOPHAGE) 500 MG tablet Take 500 mg by mouth 2 (two) times daily with a meal.    . rosuvastatin (CRESTOR) 20 MG tablet Take 20 mg by mouth daily.    . sildenafil (REVATIO) 20 MG tablet 2-5 tabs 1 hour prior to intercourse 30 tablet 0   No current facility-administered medications on file prior to visit.     BP 130/70   Pulse (!) 104   Temp (!) 102.2 F (39 C) (Oral)   Ht 5\' 7"  (1.702 m)   Wt 174 lb 4 oz (79 kg)   SpO2 97%   BMI 27.29 kg/m    Objective:   Physical Exam  Constitutional:  He is oriented to person, place, and time. He appears well-nourished. He appears ill.  Neck: Neck supple.  Cardiovascular: Normal rate and regular rhythm.  Pulmonary/Chest: Effort normal and breath sounds normal. He has no wheezes. He has no rales.  Abdominal: Soft. Bowel sounds are increased. There is tenderness in the right upper quadrant. There is no rebound, no guarding and negative Murphy's sign.  Mild distension   Neurological: He is alert and oriented to person, place, and time.  Skin: Skin is warm and dry.  Psychiatric: He has a normal mood and affect.          Assessment & Plan:  Abdominal Pain:  Generalized, also with diarrhea, fevers, nausea. Check labs today including acute hepatitis panel, CBC, CMP, lipase, GGT. Stat CT abdomen/pelvis ordered and pending. Consider cholecystitis, cholelithiasis, acute hepatitis, acute pancreatitis, colitis. Less likely acute appendicitis. Discussed strict hospital precautions and to go if symptoms progress.  Doreene Nest, NP

## 2017-10-18 NOTE — Telephone Encounter (Signed)
Since Sunday pt c/o intermittent abdominal cramping that is located at the center of his abdomen. Pain is moderate and developed gradually. Pt states he was febrile to 103 last night, no fever reported today. Pt has tried [employer]pto Bismol and last night he noted black diarrhea last night and is brown in color today. Pt c/o abdominal bloating and intermittent cold sweats. Care advice given and disposition is to see provider within 24 hours. Pt wanting appt today. Appt made with Mayra ReelKate Clark NP at 2:30 today.  Reason for Disposition . [1] MODERATE pain (e.g., interferes with normal activities) AND [2] pain comes and goes (cramps) AND [3] present > 24 hours  (Exception: pain with Vomiting or Diarrhea - see that Guideline)  Additional Information . Negative: Fever > 103 F (39.4 C)    Was febrile last night to 103  Answer Assessment - Initial Assessment Questions 1. LOCATION: "Where does it hurt?"      Center at navel area 2. RADIATION: "Does the pain shoot anywhere else?" (e.g., chest, back)     no 3. ONSET: "When did the pain begin?" (Minutes, hours or days ago)      Sunday 4. SUDDEN: "Gradual or sudden onset?"     gradually 5. PATTERN "Does the pain come and go, or is it constant?"    - If constant: "Is it getting better, staying the same, or worsening?"      (Note: Constant means the pain never goes away completely; most serious pain is constant and it progresses)     - If intermittent: "How long does it last?" "Do you have pain now?"     (Note: Intermittent means the pain goes away completely between bouts)     Comes and goes 6. SEVERITY: "How bad is the pain?"  (e.g., Scale 1-10; mild, moderate, or severe)    - MILD (1-3): doesn't interfere with normal activities, abdomen soft and not tender to touch     - MODERATE (4-7): interferes with normal activities or awakens from sleep, tender to touch     - SEVERE (8-10): excruciating pain, doubled over, unable to do any normal activities    moderate 7. RECURRENT SYMPTOM: "Have you ever had this type of abdominal pain before?" If so, ask: "When was the last time?" and "What happened that time?"      no 8. CAUSE: "What do you think is causing the abdominal pain?"     Pt is not sure 9. RELIEVING/AGGRAVATING FACTORS: "What makes it better or worse?" (e.g., movement, antacids, bowel movement)     Nothing makes it better 10. OTHER SYMPTOMS: "Has there been any vomiting, diarrhea, constipation, or urine problems?"       Nausea,diarrhea (black last night now brown),last 102, cold sweats, bloating  Protocols used: ABDOMINAL PAIN - MALE-A-AH

## 2017-10-18 NOTE — Patient Instructions (Addendum)
Stop by the lab prior to leaving today. I will notify you of your results once received.   Stop by the front desk and speak with either Shirlee LimerickMarion or Anastasiya regarding your CT scan.  It was a pleasure meeting you!

## 2017-10-19 ENCOUNTER — Ambulatory Visit: Payer: BLUE CROSS/BLUE SHIELD | Admitting: Urology

## 2017-10-19 ENCOUNTER — Encounter: Payer: Self-pay | Admitting: Urology

## 2017-10-19 ENCOUNTER — Encounter: Payer: Self-pay | Admitting: Primary Care

## 2017-10-19 LAB — HEPATITIS PANEL, ACUTE
HEP B C IGM: NONREACTIVE
HEP C AB: NONREACTIVE
Hep A IgM: NONREACTIVE
Hepatitis B Surface Ag: NONREACTIVE
SIGNAL TO CUT-OFF: 0.22 (ref <1.00)

## 2017-10-27 ENCOUNTER — Encounter: Payer: Self-pay | Admitting: *Deleted

## 2017-11-10 ENCOUNTER — Encounter: Payer: Self-pay | Admitting: Behavioral Health

## 2017-11-21 ENCOUNTER — Other Ambulatory Visit: Payer: BLUE CROSS/BLUE SHIELD

## 2017-11-21 DIAGNOSIS — R6882 Decreased libido: Secondary | ICD-10-CM | POA: Diagnosis not present

## 2017-11-21 DIAGNOSIS — N529 Male erectile dysfunction, unspecified: Secondary | ICD-10-CM

## 2017-11-21 DIAGNOSIS — N400 Enlarged prostate without lower urinary tract symptoms: Secondary | ICD-10-CM

## 2017-11-22 ENCOUNTER — Telehealth: Payer: Self-pay

## 2017-11-22 LAB — TESTOSTERONE: TESTOSTERONE: 236 ng/dL — AB (ref 264–916)

## 2017-11-22 LAB — PSA: Prostate Specific Ag, Serum: 0.7 ng/mL (ref 0.0–4.0)

## 2017-11-22 LAB — LUTEINIZING HORMONE: LH: 10.4 m[IU]/mL — AB (ref 1.7–8.6)

## 2017-11-22 NOTE — Telephone Encounter (Signed)
Pt informed, he will discuss this at his next appointment.

## 2017-11-22 NOTE — Telephone Encounter (Signed)
-----   Message from [name], MD sent at 11/22/2017  8:05 AM EDT ----- Testosterone level was low.  If he is interested in testosterone replacement will need a repeat a.m. testosterone level and would also check a prolactin.

## 2017-12-04 ENCOUNTER — Telehealth: Payer: Self-pay | Admitting: Pharmacist

## 2017-12-04 NOTE — Telephone Encounter (Signed)
Patient was switched from Atripla to [place] back in April.  He called me this morning and stated that the Biktarvy was causing him to have insomnia.  He takes it at 1030pm and wakes up around 130am and cannot fall back asleep sometimes.  He is used to taking his medication at night from back when he was taking Atripla.   I told him to try and start taking it in the morning instead of right before bed and he is also asking if he can take anything for him to help sleep.  He really likes Biktarvy and doesn't wish to change at this time. I told him to get some melatonin OTC and take before he goes to bed to see if it will help him fall asleep and stay asleep longer. He will look into it and call me back if it doesn't help him.

## 2017-12-04 NOTE — Telephone Encounter (Signed)
Perfect thank you, Cassie. Hopefully the Melatonin will help!

## 2017-12-19 ENCOUNTER — Encounter: Payer: Self-pay | Admitting: Urology

## 2017-12-19 ENCOUNTER — Ambulatory Visit: Payer: BLUE CROSS/BLUE SHIELD | Admitting: Urology

## 2017-12-19 VITALS — BP 192/78 | HR 78 | Resp 16 | Ht 68.0 in | Wt 175.0 lb

## 2017-12-19 DIAGNOSIS — E291 Testicular hypofunction: Secondary | ICD-10-CM | POA: Diagnosis not present

## 2017-12-19 DIAGNOSIS — N5201 Erectile dysfunction due to arterial insufficiency: Secondary | ICD-10-CM | POA: Diagnosis not present

## 2017-12-19 MED ORDER — SILDENAFIL CITRATE 20 MG PO TABS: ORAL_TABLET | ORAL | 1 refills | Status: DC

## 2017-12-19 NOTE — Progress Notes (Signed)
12/19/2017 9:11 AM   Clinton Watts Aug 08, 1955 [id number]  Referring provider: Emi Belfast, FNP [address] [place], [place] [address]  Chief Complaint  Patient presents with  . Follow-up    HPI: 62 year old male presents for follow-up of erectile dysfunction.  Refer to my previous note dated 09/20/2017.  He states generic sildenafil was effective at 100 mg.  His testosterone level was low at 236 ng/dL.  PSA was 0.7.  LH was 10.4.   PMH: Past Medical History:  Diagnosis Date  . Diabetes mellitus without complication (HCC)   . HIV (human immunodeficiency virus infection) (HCC) 10/12/2017  . Hyperlipidemia     Surgical History: Past Surgical History:  Procedure Laterality Date  . HERNIA REPAIR      Home Medications:  Allergies as of 12/19/2017   No Known Allergies     Medication List        Accurate as of 12/19/17  9:11 AM. Always use your most recent med list.          bictegravir-emtricitabine-tenofovir AF 50-200-25 MG Tabs tablet Commonly known as:  BIKTARVY Take 1 tablet by mouth daily.   metFORMIN 500 MG tablet Commonly known as:  GLUCOPHAGE Take 500 mg by mouth 2 (two) times daily with a meal.   rosuvastatin 20 MG tablet Commonly known as:  CRESTOR Take 20 mg by mouth daily.   sildenafil 20 MG tablet Commonly known as:  REVATIO 2-5 tabs 1 hour prior to intercourse       Allergies: No Known Allergies  Family History: Family History  Problem Relation Age of Onset  . Diabetes Mother   . Stomach cancer Sister   . Pancreatic cancer Brother   . Prostate cancer Neg Hx   . Bladder Cancer Neg Hx   . Kidney cancer Neg Hx     Social History:  reports that he has never smoked. He has never used smokeless tobacco. He reports that he does not drink alcohol or use drugs.  ROS: UROLOGY Frequent Urination?: No Hard to postpone urination?: No Burning/pain with urination?: No Get up at night to urinate?: No Leakage of urine?: No Urine  stream starts and stops?: No Trouble starting stream?: No Do you have to strain to urinate?: No Blood in urine?: No Urinary tract infection?: No Sexually transmitted disease?: No Injury to kidneys or bladder?: No Painful intercourse?: No Weak stream?: No Erection problems?: No Penile pain?: No  Gastrointestinal Nausea?: No Vomiting?: No Indigestion/heartburn?: No Diarrhea?: No Constipation?: No  Constitutional Fever: No Night sweats?: No Weight loss?: No Fatigue?: No  Skin Skin rash/lesions?: No Itching?: No  Eyes Blurred vision?: No Double vision?: No  Ears/Nose/Throat Sore throat?: No Sinus problems?: No  Hematologic/Lymphatic Swollen glands?: No Easy bruising?: No  Cardiovascular Leg swelling?: No Chest pain?: No  Respiratory Cough?: No Shortness of breath?: No  Endocrine Excessive thirst?: No  Musculoskeletal Back pain?: No Joint pain?: No  Neurological Headaches?: No Dizziness?: No  Psychologic Depression?: No Anxiety?: No  Physical Exam: BP (!) 192/78   Pulse 78   Resp 16   Ht 5\' 8"  (1.727 m)   Wt 175 lb (79.4 kg)   SpO2 96%   BMI 26.61 kg/m    Constitutional:  Alert and oriented, No acute distress. HEENT: [place] AT, moist mucus membranes.  Trachea midline, no masses. Cardiovascular: No clubbing, cyanosis, or edema. Respiratory: Normal respiratory effort, no increased work of breathing.   Laboratory Data: Lab Results  Component Value Date  WBC 5.7 10/18/2017   HGB 12.7 (L) 10/18/2017   HCT 38.9 (L) 10/18/2017   MCV 64.4 (L) 10/18/2017   PLT 164.0 10/18/2017    Lab Results  Component Value Date   CREATININE 1.07 10/18/2017    Lab Results  Component Value Date   PSA 0.5 02/29/2016    Lab Results  Component Value Date   TESTOSTERONE 236 (L) 11/21/2017    Lab Results  Component Value Date   HGBA1C 6.8 (H) 08/25/2017    Assessment & Plan:   62 year old male with erectile dysfunction.  Generic sildenafil is  effective at 100 mg.  He inquired if there was a higher dose and was informed he is at the max for this drug.  I did discuss generic tadalafil however this will be more expensive.  He requested a refill of the sildenafil.  I also discussed TRT and he would need a repeat a.m. testosterone level.  He wanted to hold off on testosterone replacement at this time but will call back if interested in pursuing.  Follow-up visit 6 months.   Riki AltesScott C Stoioff, MD  [facility] [address], [address] [place], [place] [address] [phone]

## 2017-12-21 ENCOUNTER — Ambulatory Visit: Payer: BLUE CROSS/BLUE SHIELD | Admitting: Primary Care

## 2017-12-21 ENCOUNTER — Encounter: Payer: Self-pay | Admitting: Primary Care

## 2017-12-21 VITALS — BP 122/64 | HR 82 | Temp 98.2°F | Ht 68.0 in | Wt 175.0 lb

## 2017-12-21 DIAGNOSIS — S0502XA Injury of conjunctiva and corneal abrasion without foreign body, left eye, initial encounter: Secondary | ICD-10-CM | POA: Diagnosis not present

## 2017-12-21 MED ORDER — ERYTHROMYCIN 5 MG/GM OP OINT: TOPICAL_OINTMENT | OPHTHALMIC | 0 refills | Status: DC

## 2017-12-21 NOTE — Patient Instructions (Signed)
Apply a pea size amount of the antibiotic ointment at bedtime for one week.  Please notify if no improvement in 3-4 days.  It was a pleasure to see you today!

## 2017-12-21 NOTE — Progress Notes (Signed)
Subjective:    Patient ID: Clinton Watts, male    DOB: Jul 09, 1955, 62 y.o.   MRN: [id number]  HPI  Mr. Shirlee LatchMcLean is a 62 year old male with a history of HIV, Type 2 diabetes who presents today with a chief complaint of eye irritation.  He was at work yesterday thinks some dust got into his eye as his symptoms began at 5:30 pm after getting off of work. He's noticed redness, swelling, clear drainage, and discomfort since then. He did wash out his eye last night, feels slightly better today and has noticed a slight reduction in redness.   He denies visual changes, itching, crusting.   Review of Systems  Constitutional: Negative for fever.  HENT: Negative for congestion.   Eyes: Positive for redness. Negative for discharge, itching and visual disturbance.       Eye irritation        Past Medical History:  Diagnosis Date  . Diabetes mellitus without complication (HCC)   . HIV (human immunodeficiency virus infection) (HCC) 10/12/2017  . Hyperlipidemia      Social History   Socioeconomic History  . Marital status: Married    Spouse name: Not on file  . Number of children: Not on file  . Years of education: Not on file  . Highest education level: Not on file  Occupational History  . Not on file  Social Needs  . Financial resource strain: Not on file  . Food insecurity:    Worry: Not on file    Inability: Not on file  . Transportation needs:    Medical: Not on file    Non-medical: Not on file  Tobacco Use  . Smoking status: Never Smoker  . Smokeless tobacco: Never Used  Substance and Sexual Activity  . Alcohol use: No    Frequency: Never  . Drug use: No  . Sexual activity: Yes  Lifestyle  . Physical activity:    Days per week: Not on file    Minutes per session: Not on file  . Stress: Not on file  Relationships  . Social connections:    Talks on phone: Not on file    Gets together: Not on file    Attends religious service: Not on file    Active member of club or  organization: Not on file    Attends meetings of clubs or organizations: Not on file    Relationship status: Not on file  . Intimate partner violence:    Fear of current or ex partner: Not on file    Emotionally abused: Not on file    Physically abused: Not on file    Forced sexual activity: Not on file  Other Topics Concern  . Not on file  Social History Narrative   Patient lives with his wife. Has 2 grown children, several grandchildren.    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     Past Surgical History:  Procedure Laterality Date  . HERNIA REPAIR      Family History  Problem Relation Age of Onset  . Diabetes Mother   . Stomach cancer Sister   . Pancreatic cancer Brother   . Prostate cancer Neg Hx   . Bladder Cancer Neg Hx   . Kidney cancer Neg Hx     No Known Allergies  Current Outpatient Medications on File Prior to Visit  Medication Sig Dispense Refill  . bictegravir-emtricitabine-tenofovir AF (BIKTARVY) 50-200-25 MG TABS tablet Take 1 tablet by mouth daily. 90  tablet 1  . metFORMIN (GLUCOPHAGE) 500 MG tablet Take 500 mg by mouth 2 (two) times daily with a meal.    . rosuvastatin (CRESTOR) 20 MG tablet Take 20 mg by mouth daily.    . sildenafil (REVATIO) 20 MG tablet 2-5 tabs 1 hour prior to intercourse 90 tablet 1   No current facility-administered medications on file prior to visit.     BP 122/64   Pulse 82   Temp 98.2 F (36.8 C) (Oral)   Ht 5\' 8"  (1.727 m)   Wt 175 lb (79.4 kg)   SpO2 97%   BMI 26.61 kg/m    Objective:   Physical Exam  Constitutional: He appears well-nourished.  HENT:  Mouth/Throat: Oropharynx is clear and moist.  Eyes: Pupils are equal, round, and reactive to light. Left eye exhibits no discharge and no exudate. No foreign body present in the left eye. Left conjunctiva is injected. Left conjunctiva has no hemorrhage. Left eye exhibits normal extraocular motion.  Fluorecin staining preformed, evidence of small abrasion to left  conjunctiva at 4 o'clock position             Assessment & Plan:  Corneal Abrasion:  Eye irritation with pain, tearing, swelling since yesterday. Fluorescein staining preformed today, evidence of scratch to cornea. Rx for erythromycin ointment sent to pharmacy. Discussed return precautions and home care instructions.   Doreene Nest, NP

## 2018-01-22 ENCOUNTER — Encounter: Payer: Self-pay | Admitting: Infectious Diseases

## 2018-01-22 ENCOUNTER — Ambulatory Visit: Payer: BLUE CROSS/BLUE SHIELD | Admitting: Infectious Diseases

## 2018-01-22 VITALS — BP 183/83 | HR 73 | Temp 98.1°F | Wt 181.0 lb

## 2018-01-22 DIAGNOSIS — Z21 Asymptomatic human immunodeficiency virus [HIV] infection status: Secondary | ICD-10-CM

## 2018-01-22 DIAGNOSIS — Z0000 Encounter for general adult medical examination without abnormal findings: Secondary | ICD-10-CM

## 2018-01-22 DIAGNOSIS — B20 Human immunodeficiency virus [HIV] disease: Secondary | ICD-10-CM | POA: Diagnosis not present

## 2018-01-22 DIAGNOSIS — R030 Elevated blood-pressure reading, without diagnosis of hypertension: Secondary | ICD-10-CM | POA: Diagnosis not present

## 2018-01-22 DIAGNOSIS — I10 Essential (primary) hypertension: Secondary | ICD-10-CM | POA: Insufficient documentation

## 2018-01-22 MED ORDER — BICTEGRAVIR-EMTRICITAB-TENOFOV 50-200-25 MG PO TABS: 1.0000 | ORAL_TABLET | Freq: Every day | ORAL | 3 refills | Status: DC

## 2018-01-22 NOTE — Patient Instructions (Signed)
It is wonderful to see you today!   Continue taking your Biktarvy once a day as you are currently. Will send in refills for you.   Will check your viral load.   You are up to date for vaccines until your 63th birthday.   Will see you back in 8 months.

## 2018-01-22 NOTE — Assessment & Plan Note (Signed)
Up to date on vaccines - pneumovax after 63yo.

## 2018-01-22 NOTE — Assessment & Plan Note (Signed)
Reports excellent adherence on Biktarvy. Will check viral load today with medication change. I asked him to please plan on returning in 8 months to check in again - he is comfortable 1-2 times a year as long as he remains undetectable. Discussed U=U concept in addition to safe sex counseling and prevention of other STIs again today.

## 2018-01-22 NOTE — Progress Notes (Signed)
Name: Clinton Watts  DOB: June 27, 1956 MRN: [id number] PCP: Emi Belfast, FNP   Patient Active Problem List   Diagnosis Date Noted  . Elevated blood pressure reading 01/22/2018  . Human immunodeficiency virus (HIV) disease (HCC) 10/12/2017  . Healthcare maintenance 10/12/2017  . Erectile dysfunction 10/12/2017  . History of hernia repair 08/26/2017  . Type 2 diabetes mellitus without complication, without long-term current use of insulin (HCC) 08/26/2017  . Hyperlipidemia associated with type 2 diabetes mellitus (HCC) 08/26/2017     Brief Narrative:  Clinton Watts is a 62 y.o. AA male with HIV infection. Originally diagnosed with HIV in 1992. Originally from [place]. HIV Risk: heterosexual contact. Previously in care with [facility] in [place] [phone]). Hx OIs: none known   Previous Regimens:  Combivir + Crixivan . Atripla --> well controlled and undetectable for years, switched for TAF regimen . Biktarvy 10/2017 -  Genotypes: . None on file or from previous provider   Subjective:  Clinton Watts is here for routine HIV care and follow up. He is doing well on his new medication Biktarvy. Had some trouble with insomnia at first but this has resolved with time and now has no trouble. Really likes how small it is and easy to get down. In the 3 month interval he had some colitis that required antibiotics to clear up but now has since been doing very well. His wife is tested for HIV regularly and they decided not to use PrEP as he has been very well controlled and they use condoms at home. Went to see urologist and taking some pills to help with erectile dysfunction and is happy with the result.   He receives routine preventative care and chronic disease management through his PCP. He is up to date on all recommended vaccines and screenings. Colonoscopy 2016 (normal).   Review of Systems  Constitutional: Negative for chills, fever, malaise/fatigue and weight loss.  HENT: Negative for  sore throat.        No dental problems  Respiratory: Negative for cough and sputum production.   Cardiovascular: Negative for chest pain and leg swelling.  Gastrointestinal: Negative for abdominal pain, diarrhea and vomiting.  Genitourinary: Negative for dysuria and flank pain.  Musculoskeletal: Negative for joint pain, myalgias and neck pain.  Skin: Negative for rash.  Neurological: Negative for dizziness, tingling and headaches.  Psychiatric/Behavioral: Negative for depression and substance abuse. The patient is not nervous/anxious and does not have insomnia.     Past Medical History:  Diagnosis Date  . Diabetes mellitus without complication (HCC)   . HIV (human immunodeficiency virus infection) (HCC) 10/12/2017  . Hyperlipidemia     Outpatient Medications Prior to Visit  Medication Sig Dispense Refill  . erythromycin ophthalmic ointment Apply pea size amount to left eye once daily at bedtime for one week. 3.5 g 0  . metFORMIN (GLUCOPHAGE) 500 MG tablet Take 500 mg by mouth 2 (two) times daily with a meal.    . rosuvastatin (CRESTOR) 20 MG tablet Take 20 mg by mouth daily.    . sildenafil (REVATIO) 20 MG tablet 2-5 tabs 1 hour prior to intercourse 90 tablet 1  . bictegravir-emtricitabine-tenofovir AF (BIKTARVY) 50-200-25 MG TABS tablet Take 1 tablet by mouth daily. 90 tablet 1   No facility-administered medications prior to visit.      No Known Allergies  Social History   Tobacco Use  . Smoking status: Never Smoker  . Smokeless tobacco: Never Used  Substance Use Topics  .  Alcohol use: No    Frequency: Never  . Drug use: No    Family History  Problem Relation Age of Onset  . Diabetes Mother   . Stomach cancer Sister   . Pancreatic cancer Brother   . Prostate cancer Neg Hx   . Bladder Cancer Neg Hx   . Kidney cancer Neg Hx     Social History   Substance and Sexual Activity  Sexual Activity Yes     Objective:   Vitals:   01/22/18 1045  BP: (!) 183/83    Pulse: 73  Temp: 98.1 F (36.7 C)  TempSrc: Oral  Weight: 181 lb (82.1 kg)   Body mass index is 27.52 kg/m.  Physical Exam  Constitutional: He is oriented to person, place, and time. Vital signs are normal. He appears well-developed and well-nourished.  HENT:  Mouth/Throat: No oral lesions. Normal dentition. No dental caries.  Eyes: No scleral icterus.  Cardiovascular: Normal rate, regular rhythm and normal heart sounds.  Pulmonary/Chest: Effort normal and breath sounds normal.  Abdominal: Soft. He exhibits no distension. There is no tenderness.  Lymphadenopathy:    He has no cervical adenopathy.  Neurological: He is alert and oriented to person, place, and time.  Skin: Skin is warm and dry. No rash noted.  Psychiatric: He has a normal mood and affect. His behavior is normal. Judgment and thought content normal.  Vitals reviewed.   Lab Results Lab Results  Component Value Date   WBC 5.7 10/18/2017   HGB 12.7 (L) 10/18/2017   HCT 38.9 (L) 10/18/2017   MCV 64.4 (L) 10/18/2017   PLT 164.0 10/18/2017    Lab Results  Component Value Date   CREATININE 1.07 10/18/2017   BUN 14 10/18/2017   NA 128 (L) 10/18/2017   K 4.3 10/18/2017   CL 95 (L) 10/18/2017   CO2 27 10/18/2017    Lab Results  Component Value Date   ALT 10 10/18/2017   AST 21 10/18/2017   ALKPHOS 56 10/18/2017   BILITOT 0.4 10/18/2017    Lab Results  Component Value Date   CHOL 211 (H) 09/27/2017   HDL 27 (L) 09/27/2017   LDLCALC 135 (H) 09/27/2017   TRIG 344 (H) 09/27/2017   CHOLHDL 7.8 (H) 09/27/2017   HIV 1 RNA Quant (Copies/mL)  Date Value  09/27/2017 <20   CD4 T Cell Abs (/uL)  Date Value  09/27/2017 450   Lab Results  Component Value Date   HGBA1C 6.8 (H) 08/25/2017    Assessment & Plan:   Problem List Items Addressed This Visit      Other   Human immunodeficiency virus (HIV) disease (HCC) - Primary    Reports excellent adherence on Biktarvy. Will check viral load today with  medication change. I asked him to please plan on returning in 8 months to check in again - he is comfortable 1-2 times a year as long as he remains undetectable. Discussed U=U concept in addition to safe sex counseling and prevention of other STIs again today.       Relevant Medications   bictegravir-emtricitabine-tenofovir AF (BIKTARVY) 50-200-25 MG TABS tablet   Other Relevant Orders   HIV 1 RNA quant-no reflex-bld   T-helper cell (CD4)- ([facility] clinic only)   Healthcare maintenance    Up to date on vaccines - pneumovax after 63yo.       Elevated blood pressure reading    Blood pressure systolic in 180s. No BP medications on file and previously much  less than this. Would recommend ongoing monitoring to see if medications are needed to control.         Rexene AlbertsStephanie Demetra Moya, MSN, NP-C [facility] Pager: [phone] Office: [phone]  01/22/18  3:48 PM

## 2018-01-22 NOTE — Assessment & Plan Note (Signed)
Blood pressure systolic in 180s. No BP medications on file and previously much less than this. Would recommend ongoing monitoring to see if medications are needed to control.

## 2018-01-23 LAB — T-HELPER CELL (CD4) - (RCID CLINIC ONLY)
CD4 T CELL ABS: 320 /uL — AB (ref 400–2700)
CD4 T CELL HELPER: 13 % — AB (ref 33–55)

## 2018-01-24 LAB — HIV-1 RNA QUANT-NO REFLEX-BLD
HIV 1 RNA QUANT: NOT DETECTED {copies}/mL
HIV-1 RNA QUANT, LOG: NOT DETECTED {Log_copies}/mL

## 2018-02-20 ENCOUNTER — Ambulatory Visit: Payer: Self-pay

## 2018-02-20 NOTE — Telephone Encounter (Signed)
Outgoing call to patient who complains of allergies and coughing.  Wanted to know what he can take over the counter? States the sneezing comes and goes. Denies running nose, stuffiness, itching. The main complaint is coughing.  States it keeps him awake.  Patient feels it the allergies that is causing this.  Has tried Robitussin not working. Denies difficulty breathing.  Recommended Claritin or Zyertec over the counter.  Patient voiced understanding.   Answer Assessment - Initial Assessment Questions 1. SYMPTOM: "What's the main symptom you're concerned about?" (e.g., runny nose, stuffiness, sneezing, itching)     Sneezing come and go 2. SEVERITY: "How bad is it?" "What does it keep you from doing?" (e.g., sleeping, working)      Yes.  Keep me up awake 3. EYES: "Are the eyes also red, watery, and itchy?"      no 4. TRIGGER: "What pollen or other allergic substance do you think is causing the symptoms?"      pollen 5. TREATMENT: "What medicine are you using?" "What medicine worked best in the past?"     robittsusin some time 6. OTHER SYMPTOMS: "Do you have any other symptoms?" (e.g., coughing, difficulty breathing, wheezing)     Coughing  7. PREGNANCY: "Is there any chance you are pregnant?" "When was your last menstrual period?"     Na  Protocols used: NASAL ALLERGIES (HAY FEVER)-A-AH

## 2018-03-01 ENCOUNTER — Encounter: Payer: Self-pay | Admitting: Internal Medicine

## 2018-03-01 ENCOUNTER — Ambulatory Visit: Payer: BLUE CROSS/BLUE SHIELD | Admitting: Internal Medicine

## 2018-03-01 ENCOUNTER — Ambulatory Visit (INDEPENDENT_AMBULATORY_CARE_PROVIDER_SITE_OTHER)
Admission: RE | Admit: 2018-03-01 | Discharge: 2018-03-01 | Disposition: A | Discharge status: Home / Self Care | Payer: BLUE CROSS/BLUE SHIELD | Source: Ambulatory Visit | Attending: Internal Medicine | Admitting: Internal Medicine

## 2018-03-01 VITALS — BP 132/80 | HR 88 | Temp 98.0°F | Wt 178.0 lb

## 2018-03-01 DIAGNOSIS — R05 Cough: Secondary | ICD-10-CM | POA: Diagnosis not present

## 2018-03-01 DIAGNOSIS — R059 Cough, unspecified: Secondary | ICD-10-CM

## 2018-03-01 MED ORDER — BENZONATATE 200 MG PO CAPS: 200.0000 mg | ORAL_CAPSULE | Freq: Three times a day (TID) | ORAL | 0 refills | Status: DC | PRN

## 2018-03-01 MED ORDER — HYDROCODONE-HOMATROPINE 5-1.5 MG/5ML PO SYRP: 5.0000 mL | ORAL_SOLUTION | Freq: Three times a day (TID) | ORAL | 0 refills | Status: DC | PRN

## 2018-03-01 NOTE — Patient Instructions (Signed)

## 2018-03-01 NOTE — Progress Notes (Signed)
HPI  Pt presents to the clinic today with c/o cough. He reports this started 2 weeks ago. The cough is dry and non productive. It seems worse at night. He denies runny nose, nasal congestion, ear pain, sore throat. He denies fever, chills or body aches. He has tried Claritin D, Robitussin, Zyrtec without any relief. He denies sick contacts. He has a history of DM 2 and HIV.  Review of Systems      Past Medical History:  Diagnosis Date  . Diabetes mellitus without complication (HCC)   . HIV (human immunodeficiency virus infection) (HCC) 10/12/2017  . Hyperlipidemia     Family History  Problem Relation Age of Onset  . Diabetes Mother   . Stomach cancer Sister   . Pancreatic cancer Brother   . Prostate cancer Neg Hx   . Bladder Cancer Neg Hx   . Kidney cancer Neg Hx     Social History   Socioeconomic History  . Marital status: Married    Spouse name: Not on file  . Number of children: Not on file  . Years of education: Not on file  . Highest education level: Not on file  Occupational History  . Not on file  Social Needs  . Financial resource strain: Not on file  . Food insecurity:    Worry: Not on file    Inability: Not on file  . Transportation needs:    Medical: Not on file    Non-medical: Not on file  Tobacco Use  . Smoking status: Never Smoker  . Smokeless tobacco: Never Used  Substance and Sexual Activity  . Alcohol use: No    Frequency: Never  . Drug use: No  . Sexual activity: Yes  Lifestyle  . Physical activity:    Days per week: Not on file    Minutes per session: Not on file  . Stress: Not on file  Relationships  . Social connections:    Talks on phone: Not on file    Gets together: Not on file    Attends religious service: Not on file    Active member of club or organization: Not on file    Attends meetings of clubs or organizations: Not on file    Relationship status: Not on file  . Intimate partner violence:    Fear of current or ex partner:  Not on file    Emotionally abused: Not on file    Physically abused: Not on file    Forced sexual activity: Not on file  Other Topics Concern  . Not on file  Social History Narrative   Patient lives with his wife. Has 2 grown children, several grandchildren.    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     No Known Allergies   Constitutional: Denies headache, fatigue, fever abrupt weight changes.  HEENT:  Denies eye redness, eye pain, pressure behind the eyes, facial pain, nasal congestion, ear pain, ringing in the ears, wax buildup, runny nose or sore throat. Respiratory: Positive cough. Denies difficulty breathing or shortness of breath.  Cardiovascular: Denies chest pain, chest tightness, palpitations or swelling in the hands or feet.   No other specific complaints in a complete review of systems (except as listed in HPI above).  Objective:   BP 132/80   Pulse 88   Temp 98 F (36.7 C) (Oral)   Wt 178 lb (80.7 kg)   SpO2 98%   BMI 27.06 kg/m  Wt Readings from Last 3 Encounters:  03/01/18 178 lb (80.7 kg)  01/22/18 181 lb (82.1 kg)  12/21/17 175 lb (79.4 kg)     General: Appears his stated age, well developed, well nourished in NAD. HEENT: Head: normal shape and size, no sinus tenderness noted; Throat/Mouth: Teeth present, mucosa pink and moist, no exudate noted, no lesions or ulcerations noted.  Neck: No cervical lymphadenopathy.  Cardiovascular: Normal rate and rhythm. S1,S2 noted.  No murmur, rubs or gallops noted.  Pulmonary/Chest: Normal effort and positive vesicular breath sounds. No respiratory distress. No wheezes, rales or ronchi noted.       Assessment & Plan:   Cough:  Get some rest and drink plenty of water Given history of HIV/DM 2 will obtain chest xray for eval of atypical pneumonia eRx for Benzonate 200 mg TID prn eRx for Hycodan cough syrup Work note provided  RTC as needed or if symptoms persist.   Nicki Reaper, NP

## 2018-05-07 ENCOUNTER — Other Ambulatory Visit: Payer: Self-pay | Admitting: Urology

## 2018-05-07 NOTE — Telephone Encounter (Signed)
Pt call requesting a refill for his Rx for sildenafil to be sent to [facility]. Please advise pt when this has been done. Thanks

## 2018-05-07 NOTE — Addendum Note (Signed)
Addended by: Honor Loh on: 05/07/2018 03:56 PM   Modules accepted: Orders

## 2018-05-07 NOTE — Telephone Encounter (Signed)
Please advise 

## 2018-05-08 MED ORDER — SILDENAFIL CITRATE 20 MG PO TABS: ORAL_TABLET | ORAL | 1 refills | Status: DC

## 2018-09-05 ENCOUNTER — Other Ambulatory Visit: Payer: Self-pay | Admitting: *Deleted

## 2018-09-05 DIAGNOSIS — B20 Human immunodeficiency virus [HIV] disease: Secondary | ICD-10-CM

## 2018-09-05 DIAGNOSIS — Z79899 Other long term (current) drug therapy: Secondary | ICD-10-CM

## 2018-09-05 DIAGNOSIS — Z113 Encounter for screening for infections with a predominantly sexual mode of transmission: Secondary | ICD-10-CM

## 2018-09-19 ENCOUNTER — Other Ambulatory Visit (HOSPITAL_COMMUNITY)
Admission: RE | Admit: 2018-09-19 | Discharge: 2018-09-19 | Disposition: A | Discharge status: Home / Self Care | Payer: BLUE CROSS/BLUE SHIELD | Source: Ambulatory Visit | Attending: Infectious Diseases | Admitting: Infectious Diseases

## 2018-09-19 ENCOUNTER — Other Ambulatory Visit: Payer: BLUE CROSS/BLUE SHIELD

## 2018-09-19 ENCOUNTER — Other Ambulatory Visit: Payer: Self-pay

## 2018-09-19 DIAGNOSIS — Z113 Encounter for screening for infections with a predominantly sexual mode of transmission: Secondary | ICD-10-CM | POA: Diagnosis not present

## 2018-09-19 DIAGNOSIS — B20 Human immunodeficiency virus [HIV] disease: Secondary | ICD-10-CM | POA: Insufficient documentation

## 2018-09-19 DIAGNOSIS — Z79899 Other long term (current) drug therapy: Secondary | ICD-10-CM | POA: Diagnosis not present

## 2018-09-20 LAB — T-HELPER CELL (CD4) - (RCID CLINIC ONLY)
CD4 % Helper T Cell: 23 % — ABNORMAL LOW (ref 33–55)
CD4 T Cell Abs: 500 /uL (ref 400–2700)

## 2018-09-20 LAB — URINE CYTOLOGY ANCILLARY ONLY
CHLAMYDIA, DNA PROBE: NEGATIVE
NEISSERIA GONORRHEA: NEGATIVE

## 2018-09-21 LAB — LIPID PANEL
CHOL/HDL RATIO: 9 (calc) — AB (ref <5.0)
Cholesterol: 270 mg/dL — ABNORMAL HIGH (ref <200)
HDL: 30 mg/dL — ABNORMAL LOW (ref >=40)
Non-HDL Cholesterol (Calc): 240 mg/dL (calc) — ABNORMAL HIGH (ref <130)
Triglycerides: 611 mg/dL — ABNORMAL HIGH (ref <150)

## 2018-09-21 LAB — CBC WITH DIFFERENTIAL/PLATELET
Absolute Monocytes: 443 cells/uL (ref 200–950)
BASOS PCT: 0.5 %
Basophils Absolute: 38 cells/uL (ref 0–200)
EOS PCT: 2.1 %
Eosinophils Absolute: 158 cells/uL (ref 15–500)
HCT: 40.7 % (ref 38.5–50.0)
HEMOGLOBIN: 13 g/dL — AB (ref 13.2–17.1)
Lymphs Abs: 2205 cells/uL (ref 850–3900)
MCH: 20.6 pg — ABNORMAL LOW (ref 27.0–33.0)
MCHC: 31.9 g/dL — ABNORMAL LOW (ref 32.0–36.0)
MCV: 64.5 fL — ABNORMAL LOW (ref 80.0–100.0)
MPV: 10.7 fL (ref 7.5–12.5)
Monocytes Relative: 5.9 %
Neutro Abs: 4658 cells/uL (ref 1500–7800)
Neutrophils Relative %: 62.1 %
PLATELETS: 235 10*3/uL (ref 140–400)
RBC: 6.31 10*6/uL — ABNORMAL HIGH (ref 4.20–5.80)
RDW: 16.3 % — ABNORMAL HIGH (ref 11.0–15.0)
Total Lymphocyte: 29.4 %
WBC: 7.5 10*3/uL (ref 3.8–10.8)

## 2018-09-21 LAB — COMPLETE METABOLIC PANEL WITH GFR
AG Ratio: 1.2 (calc) (ref 1.0–2.5)
ALBUMIN MSPROF: 4.2 g/dL (ref 3.6–5.1)
ALKALINE PHOSPHATASE (APISO): 104 U/L (ref 35–144)
ALT: 15 U/L (ref 9–46)
AST: 19 U/L (ref 10–35)
BUN: 16 mg/dL (ref 7–25)
CALCIUM: 9.4 mg/dL (ref 8.6–10.3)
CO2: 27 mmol/L (ref 20–32)
CREATININE: 1.2 mg/dL (ref 0.70–1.25)
Chloride: 96 mmol/L — ABNORMAL LOW (ref 98–110)
GFR, EST NON AFRICAN AMERICAN: 64 mL/min/{1.73_m2} (ref >=60)
GFR, Est African American: 74 mL/min/{1.73_m2} (ref >=60)
GLOBULIN: 3.6 g/dL (ref 1.9–3.7)
Glucose, Bld: 346 mg/dL — ABNORMAL HIGH (ref 65–99)
Potassium: 4.9 mmol/L (ref 3.5–5.3)
SODIUM: 131 mmol/L — AB (ref 135–146)
Total Bilirubin: 0.3 mg/dL (ref 0.2–1.2)
Total Protein: 7.8 g/dL (ref 6.1–8.1)

## 2018-09-21 LAB — RPR: RPR Ser Ql: NONREACTIVE

## 2018-09-21 LAB — HIV-1 RNA QUANT-NO REFLEX-BLD
HIV 1 RNA QUANT: DETECTED {copies}/mL — AB
HIV-1 RNA Quant, Log: <1.3 Log copies/mL — AB

## 2018-10-01 ENCOUNTER — Ambulatory Visit: Payer: BLUE CROSS/BLUE SHIELD | Admitting: Infectious Diseases

## 2018-10-04 ENCOUNTER — Other Ambulatory Visit: Payer: Self-pay

## 2018-10-04 ENCOUNTER — Telehealth: Payer: Self-pay

## 2018-10-04 ENCOUNTER — Other Ambulatory Visit: Payer: Self-pay | Admitting: Family Medicine

## 2018-10-04 ENCOUNTER — Encounter: Payer: Self-pay | Admitting: Infectious Diseases

## 2018-10-04 ENCOUNTER — Ambulatory Visit (INDEPENDENT_AMBULATORY_CARE_PROVIDER_SITE_OTHER): Payer: BLUE CROSS/BLUE SHIELD | Admitting: Infectious Diseases

## 2018-10-04 DIAGNOSIS — Z0000 Encounter for general adult medical examination without abnormal findings: Secondary | ICD-10-CM | POA: Diagnosis not present

## 2018-10-04 DIAGNOSIS — E119 Type 2 diabetes mellitus without complications: Secondary | ICD-10-CM

## 2018-10-04 DIAGNOSIS — E1169 Type 2 diabetes mellitus with other specified complication: Secondary | ICD-10-CM | POA: Diagnosis not present

## 2018-10-04 DIAGNOSIS — E785 Hyperlipidemia, unspecified: Secondary | ICD-10-CM

## 2018-10-04 DIAGNOSIS — B20 Human immunodeficiency virus [HIV] disease: Secondary | ICD-10-CM

## 2018-10-04 MED ORDER — METFORMIN HCL 500 MG PO TABS: 500.0000 mg | ORAL_TABLET | Freq: Two times a day (BID) | ORAL | 0 refills | Status: DC

## 2018-10-04 MED ORDER — BICTEGRAVIR-EMTRICITAB-TENOFOV 50-200-25 MG PO TABS: 1.0000 | ORAL_TABLET | Freq: Every day | ORAL | 3 refills | Status: DC

## 2018-10-04 MED ORDER — ROSUVASTATIN CALCIUM 20 MG PO TABS: 20.0000 mg | ORAL_TABLET | Freq: Every day | ORAL | 0 refills | Status: DC

## 2018-10-04 NOTE — Telephone Encounter (Signed)
I noticed that patient is scheduled for [facility] tomorrow-10/05/2018, due to elevated blood sugars. Had recent labs done with infectious disease office per epic. Patient has not seen [name], NP for follow up since his initial visit on 08/25/2017. A1C at that time was checked and it was 6.8-not checked since then. Sending this note to the provider to see if patient needs to come by for A1C or labs prior to his appointment at 2 pm tomorrow or if anything else needs to be done prior or different. Thank you

## 2018-10-04 NOTE — Assessment & Plan Note (Signed)
No continues to demonstrate excellent control of his HIV disease.  I will continue Biktarvy 1 pill once a day for him now.  I will provide 90 days of refills.  We discussed briefly the COVID-19 outbreak and suggestions on how to stay healthy while he is still working.  I told him the decision to put a mask on his up to him and his perceived comfort level.  He is in the best case scenario on constant medications daily, excellent CD4 count, suppressed virus.  We discussed handwashing, not touching face, staying away from people from a 6 foot distance.

## 2018-10-04 NOTE — Patient Instructions (Signed)
It was very nice to talk to you on the phone today.  I am glad to hear that you are staying well and keeping healthy.    Your medication is working perfectly for you and you are doing a great job taking it as prescribed  Please continue your Biktarvy 1 pill once a day as we discussed.  I sent a 90-day supplies with refills for you  I sent in 30-day supplies for your metformin and cholesterol medication.  I want you to contact your primary care provider you may need a medication adjustment for your diabetes.  Vaccines Recommended:   Flu shot in October.  Recommended Next Office Visit:   68-month office visit made.  Please reference my chart for appointment specifics  If you are interested in taking a supplement on the fan of zinc gluconate and vitamin C.  You can pick these up over-the-counter at any pharmacy.  If you decide to take these please just separate from your Biktarvy by at least 4 hours.  We do not want them in the stomach at the same time.  The best supplement is soap and water and good handwashing.  Please continue to be well, stay away from folks that are sick, wash her hands frequently, do not touch her face or mouth and continue to take your medications.

## 2018-10-04 NOTE — Telephone Encounter (Signed)
Please see if he can come by for a drive through [id number]. I have put in future order.

## 2018-10-04 NOTE — Progress Notes (Addendum)
Name: Clinton Watts  VQX:[id number]   DOB: August 20, 1955   PCP: Emi Belfast, FNP    Virtual Visit via Telephone Note  I connected with Clinton Watts on 10/04/18 at 10:00 AM EDT by telephone and verified that I am speaking with the correct person using two identifiers.   I discussed the limitations, risks, security and privacy concerns of performing an evaluation and management service by telephone and the availability of in person appointments. I also discussed with the patient that there may be a patient responsible charge related to this service. The patient expressed understanding and agreed to proceed.   Chief Complaint  Patient presents with   EVISIT    needs refills (90 day supply), advice re: work during pandemic (is essential employee)     History of Present Illness: Clinton Watts is a 63 y.o. AA male with HIV infection. Originally diagnosed with HIV in 1992. Originally from [place]. HIV Risk: heterosexual contact. Previously in care with [facility] in [place] [phone]). Hx OIs: none known   Previous Regimens:  Combivir + Crixivan  Atripla --> well controlled and undetectable for years, switched for TAF regimen  Biktarvy 10/2017 - suppressed  Genotypes:  None on file or from previous provider    He is a Interior and spatial designer at [employer] and wants to know if it is safe for him to continue to work. He has had higher volumes of customers lately looking for projects at home. He has not missed any doses of his Biktarvy and takes it faithfully. He has no trouble with side effects at all and would like 90d refills if possible. His wife is well and staying at home out of work currently.   He has been out of his cholesterol and diabetes medications due to missed office appointments for a few months, which he says explains his high blood sugars on labs. He denies any dizziness, excessive thirst or urination. No wounds.   Reports no complaints today suggestive of  associated opportunistic infection or advancing HIV disease such as fevers, night sweats, weight loss, anorexia, cough, SOB, nausea, vomiting, diarrhea, headache, sensory changes, lymphadenopathy or oral thrush.     Medical/surgical/social/family history have been updated during today's visit.     Observations/Objective: Clinton Watts sounds to be in good spirits on the phone today. A little anxious about what is going on with the pandemic, understandably.   HIV 1 RNA Quant  Date Value  09/19/2018 <20 DETECTED copies/mL (A)  01/22/2018 <20 NOT DETECTED copies/mL  09/27/2017 <20 Copies/mL   CD4 T Cell Abs (/uL)  Date Value  09/19/2018 500  01/22/2018 320 (L)  09/27/2017 450    Lab Results  Component Value Date   CREATININE 1.20 09/19/2018   CREATININE 1.07 10/18/2017   CREATININE 1.00 09/27/2017    Lab Results  Component Value Date   WBC 7.5 09/19/2018   HGB 13.0 (L) 09/19/2018   HCT 40.7 09/19/2018   MCV 64.5 (L) 09/19/2018   PLT 235 09/19/2018    Lab Results  Component Value Date   ALT 15 09/19/2018   AST 19 09/19/2018   ALKPHOS 56 10/18/2017   BILITOT 0.3 09/19/2018     Assessment and Plan: Problem List Items Addressed This Visit      Unprioritized   Type 2 diabetes mellitus without complication, without long-term current use of insulin (HCC)    We will refill his metformin 500 mg twice a day.  His blood sugars were above 300 on the recent  lab work.  He has been off of his medications for several months.  I have asked him to please follow-up with his primary care provider to ensure he needs no medication adjustment.  There is an interaction between [place] and metformin however with monitoring on previous lab work I do not see any evidence of lactic acidosis.      Relevant Medications   metFORMIN (GLUCOPHAGE) 500 MG tablet   rosuvastatin (CRESTOR) 20 MG tablet   Hyperlipidemia associated with type 2 diabetes mellitus (HCC)    Refilled rosuvastatin.  Follow-up with  primary care provider.      Relevant Medications   metFORMIN (GLUCOPHAGE) 500 MG tablet   rosuvastatin (CRESTOR) 20 MG tablet   Human immunodeficiency virus (HIV) disease (HCC) - Primary    No continues to demonstrate excellent control of his HIV disease.  I will continue Biktarvy 1 pill once a day for him now.  I will provide 90 days of refills.  We discussed briefly the COVID-19 outbreak and suggestions on how to stay healthy while he is still working.  I told him the decision to put a mask on his up to him and his perceived comfort level.  He is in the best case scenario on constant medications daily, excellent CD4 count, suppressed virus.  We discussed handwashing, not touching face, staying away from people from a 6 foot distance.       Relevant Medications   bictegravir-emtricitabine-tenofovir AF (BIKTARVY) 50-200-25 MG TABS tablet   Healthcare maintenance    He is up-to-date on recommended vaccines.  Flu shot at next office visit along with pneumonia booster as he will be 63.          Follow Up Instructions: He will follow-up in 6 months.  I have asked him to reach out to his primary care provider team for an appointment as he is overdue.   I discussed the assessment and treatment plan with the patient. The patient was provided an opportunity to ask questions and all were answered. The patient agreed with the plan and demonstrated an understanding of the instructions.   The patient was advised to call back or seek an in-person evaluation if the symptoms worsen or if the condition fails to improve as anticipated.  I provided 30 minutes of non-face-to-face time during this encounter.   Rexene Alberts, MSN, NP-C [facility]  [email or web address] Pager: [phone] Office: [phone] [facility] Main Line: [phone]

## 2018-10-04 NOTE — Assessment & Plan Note (Addendum)
He is up-to-date on recommended vaccines.  Flu shot at next office visit along with pneumonia booster as he will be 63.

## 2018-10-04 NOTE — Telephone Encounter (Signed)
Noted  

## 2018-10-04 NOTE — Telephone Encounter (Signed)
Received inbound call from patient reporting concern with elevated blood glucose readings despite medication adherence.  Patient is able to do a virtual video appointment. Appointment scheduled 10/05/18 @ 1400. [email or web address]

## 2018-10-04 NOTE — Assessment & Plan Note (Signed)
Refilled rosuvastatin.  Follow-up with primary care provider.

## 2018-10-04 NOTE — Assessment & Plan Note (Signed)
We will refill his metformin 500 mg twice a day.  His blood sugars were above 300 on the recent lab work.  He has been off of his medications for several months.  I have asked him to please follow-up with his primary care provider to ensure he needs no medication adjustment.  There is an interaction between [place] and metformin however with monitoring on previous lab work I do not see any evidence of lactic acidosis.

## 2018-10-04 NOTE — Telephone Encounter (Signed)
Spoke with patient and he will come around 1:30 tomorrow for this. He works and is not able to do any other time.

## 2018-10-04 NOTE — Progress Notes (Signed)
Order for hgba1c entered

## 2018-10-05 ENCOUNTER — Ambulatory Visit: Payer: BLUE CROSS/BLUE SHIELD | Admitting: Family Medicine

## 2018-10-05 ENCOUNTER — Other Ambulatory Visit: Payer: BLUE CROSS/BLUE SHIELD

## 2018-10-05 ENCOUNTER — Telehealth: Payer: Self-pay | Admitting: Family Medicine

## 2018-10-05 NOTE — Telephone Encounter (Signed)
Called and left patient a message regarding cancelled appointment for today. Patient has not been seen in office in over 1 year. Left message that follow up required within the next 4 weeks (virtual is fine), or he will be dismissed from practice.

## 2018-10-13 ENCOUNTER — Other Ambulatory Visit: Payer: Self-pay

## 2018-10-26 ENCOUNTER — Other Ambulatory Visit: Payer: Self-pay

## 2018-10-29 MED ORDER — METFORMIN HCL 500 MG PO TABS: 500.0000 mg | ORAL_TABLET | Freq: Two times a day (BID) | ORAL | 0 refills | Status: DC

## 2018-10-30 ENCOUNTER — Other Ambulatory Visit: Payer: Self-pay

## 2018-10-31 ENCOUNTER — Encounter: Payer: Self-pay | Admitting: Family Medicine

## 2018-11-01 NOTE — Telephone Encounter (Signed)
Clinton Watts with [facility] said that pt is requesting refill from [name] FNP as PCP. Clinton Watts said ins will only pay for 90 day rx. Fax # [phone].

## 2018-11-01 NOTE — Telephone Encounter (Signed)
Last office visit 02/28/2018 with R. Baity for cough.  Last seen by PCP 08/25/2017.  Cancelled lab and webex appointment on 10/05/2018  No future appointments.  Refill?

## 2018-11-02 ENCOUNTER — Telehealth: Payer: Self-pay | Admitting: Family Medicine

## 2018-11-02 NOTE — Telephone Encounter (Signed)
Please call patient and tell him that an office visit (virtual is fine) and labs are necessary prior to any medication refills.

## 2018-11-02 NOTE — Telephone Encounter (Signed)
I spoke to patient and he scheduled appointment with Debbie on 11/07/18. Patient is off of work on 11/07/18.  Patient is out of his medication. Can a rx be sent to [facility], so he'll have medication up until his appointment? Once he's seen, he wants the 90 day sent to [facility].  Please call patient to let him know if medication can be called in to [facility].

## 2018-11-02 NOTE — Telephone Encounter (Signed)
Called pt and left a detailed VM advised pt to call back to schedule for appt. CRM created and sent to [facility] pool.

## 2018-11-02 NOTE — Telephone Encounter (Signed)
Pt has an appt with PCP 5/6 Dox[facility]

## 2018-11-02 NOTE — Telephone Encounter (Signed)
Pt has an appt 5/6 D[facility] with PCP

## 2018-11-04 ENCOUNTER — Other Ambulatory Visit: Payer: Self-pay | Admitting: Family Medicine

## 2018-11-04 MED ORDER — METFORMIN HCL 500 MG PO TABS: 500.0000 mg | ORAL_TABLET | Freq: Two times a day (BID) | ORAL | 0 refills | Status: DC

## 2018-11-07 ENCOUNTER — Ambulatory Visit: Payer: BLUE CROSS/BLUE SHIELD | Admitting: Family Medicine

## 2018-11-12 ENCOUNTER — Ambulatory Visit (INDEPENDENT_AMBULATORY_CARE_PROVIDER_SITE_OTHER): Payer: BLUE CROSS/BLUE SHIELD | Admitting: Family Medicine

## 2018-11-12 ENCOUNTER — Encounter: Payer: Self-pay | Admitting: Family Medicine

## 2018-11-12 ENCOUNTER — Other Ambulatory Visit (INDEPENDENT_AMBULATORY_CARE_PROVIDER_SITE_OTHER): Payer: BLUE CROSS/BLUE SHIELD

## 2018-11-12 ENCOUNTER — Other Ambulatory Visit: Payer: Self-pay | Admitting: Family Medicine

## 2018-11-12 VITALS — BP 154/88 | HR 72 | Temp 98.3°F | Ht 68.0 in | Wt 175.0 lb

## 2018-11-12 DIAGNOSIS — N5201 Erectile dysfunction due to arterial insufficiency: Secondary | ICD-10-CM | POA: Diagnosis not present

## 2018-11-12 DIAGNOSIS — E1169 Type 2 diabetes mellitus with other specified complication: Secondary | ICD-10-CM

## 2018-11-12 DIAGNOSIS — E119 Type 2 diabetes mellitus without complications: Secondary | ICD-10-CM

## 2018-11-12 DIAGNOSIS — E785 Hyperlipidemia, unspecified: Secondary | ICD-10-CM

## 2018-11-12 LAB — POCT GLYCOSYLATED HEMOGLOBIN (HGB A1C): Hemoglobin A1C: 9.6 % — AB (ref 4.0–5.6)

## 2018-11-12 MED ORDER — METFORMIN HCL 1000 MG PO TABS: 1000.0000 mg | ORAL_TABLET | Freq: Two times a day (BID) | ORAL | 3 refills | Status: DC

## 2018-11-12 MED ORDER — SILDENAFIL CITRATE 20 MG PO TABS: ORAL_TABLET | ORAL | 1 refills | Status: DC

## 2018-11-12 MED ORDER — ROSUVASTATIN CALCIUM 20 MG PO TABS: 20.0000 mg | ORAL_TABLET | Freq: Every day | ORAL | 3 refills | Status: DC

## 2018-11-12 NOTE — Progress Notes (Signed)
Virtual Visit via Video Note  I connected with Clinton Watts on 11/12/18 at 11:15 AM EDT by a video enabled telemedicine application and verified that I am speaking with the correct person using two identifiers.  Location: Patient: In his car Provider: LBPMila Mer[facility] [place]   I discussed the limitations of evaluation and management by telemedicine and the availability of in person appointments. The patient expressed understanding and agreed to proceed.  History of Present Illness: This is a 63 yo male who presents for virtual visit for follow up of DM, hyperlipidemia. He has not been seen by me in over 1 year (08/25/2017). He was recently seen by ID for follow up of chronic HIV infection and was found to have a very high blood glucose level as well as elevated lipids. He had been off his metformin as well as his crestor and was given a short term supply until he could be seen by PCP.  Diabetes mellitus type 2-was previously well maintained on metformin 500 mg twice a day with last hemoglobin A1c 08/23/2017 at 6.8.  He reports that he had been off of his metformin for at least a month.  He denies any significant change in his diet.  He does a fairly physical job 5 days a week working at [employer].  He does occasionally check his blood sugars at home and reports that they sometimes run high.  He denies any side effects from medication.  Hyperlipidemia- lipid panel was checked 09/19/2018 in ID clinic.  Cholesterol, triglycerides, LDL were elevated and HDL was low.  He had been off of his Crestor for at least 1 month and was restarted on this.  Erectile dysfunction- good results with sildenafil and he requests refill of this today.  ROS- as above, denies chest pain, SOB, headaches, abdominal pain  Past Medical History:  Diagnosis Date  . Diabetes mellitus without complication (HCC)   . HIV (human immunodeficiency virus infection) (HCC) 10/12/2017  . Hyperlipidemia    Past Surgical History:  Procedure  Laterality Date  . HERNIA REPAIR     Family History  Problem Relation Age of Onset  . Diabetes Mother   . Stomach cancer Sister   . Pancreatic cancer Brother   . Prostate cancer Neg Hx   . Bladder Cancer Neg Hx   . Kidney cancer Neg Hx    Social History   Tobacco Use  . Smoking status: Never Smoker  . Smokeless tobacco: Never Used  Substance Use Topics  . Alcohol use: No    Frequency: Never  . Drug use: No      Observations/Objective: The patient is alert and answers questions appropriately.  Visible skin is unremarkable.  The patient is normally conversive without shortness of breath.  His mood and affect are appropriate.  BP (!) 154/88   Pulse 72   Temp 98.3 F (36.8 C)   Ht 5\' 8"  (1.727 m)   Wt 175 lb (79.4 kg) Comment: per patient  BMI 26.61 kg/m  Wt Readings from Last 3 Encounters:  11/12/18 175 lb (79.4 kg)  03/01/18 178 lb (80.7 kg)  01/22/18 181 lb (82.1 kg)   BP Readings from Last 3 Encounters:  11/12/18 (!) 154/88  03/01/18 132/80  01/22/18 (!) 183/83    Depression screen PHQ 2/9 10/04/2018 01/22/2018 10/12/2017  Decreased Interest 0 0 0  Down, Depressed, Hopeless 0 0 0  PHQ - 2 Score 0 0 0    Assessment and Plan: 1. Type 2 diabetes mellitus  without complication, without long-term current use of insulin (HCC) -We will increase his metformin from 500 mg twice daily to 1000 mg twice daily -Follow-up in 3 months -Encouraged him to reduce his carbohydrates and increase lean proteins and vegetables - metFORMIN (GLUCOPHAGE) 1000 MG tablet; Take 1 tablet (1,000 mg total) by mouth 2 (two) times daily with a meal.  Dispense: 180 tablet; Refill: 3  2. Hyperlipidemia associated with type 2 diabetes mellitus (HCC) -Follow-up in 3 months - rosuvastatin (CRESTOR) 20 MG tablet; Take 1 tablet (20 mg total) by mouth daily.  Dispense: 90 tablet; Refill: 3  3. Erectile dysfunction due to arterial insufficiency - sildenafil (REVATIO) 20 MG tablet; 2-5 tabs 1 hour  prior to intercourse  Dispense: 60 tablet; Refill: 1   [name], FNP-BC  [facility]  11/12/2018 11:50 AM   Follow Up Instructions: Recap of visit sent to patient via my chart   I discussed the assessment and treatment plan with the patient. The patient was provided an opportunity to ask questions and all were answered. The patient agreed with the plan and demonstrated an understanding of the instructions.   The patient was advised to call back or seek an in-person evaluation if the symptoms worsen or if the condition fails to improve as anticipated.   Emi Belfast, FNP

## 2018-11-12 NOTE — Progress Notes (Signed)
hgba °

## 2018-11-16 ENCOUNTER — Telehealth: Payer: Self-pay | Admitting: Family Medicine

## 2018-11-16 NOTE — Telephone Encounter (Signed)
Best number [phone] [facility]  Debbie wrote rx for metformin for 15 days and pt insurance will only pay for 90 days  Can this be changed Please advise

## 2018-11-16 NOTE — Telephone Encounter (Signed)
RX was sent in for 90 day supply on 11/12/2018. [facility] and could not get to speak to anyone. Kept getting redirected.

## 2018-11-19 ENCOUNTER — Other Ambulatory Visit: Payer: Self-pay | Admitting: Family Medicine

## 2018-12-22 ENCOUNTER — Other Ambulatory Visit: Payer: Self-pay | Admitting: Family Medicine

## 2019-01-07 ENCOUNTER — Other Ambulatory Visit: Payer: Self-pay | Admitting: Infectious Diseases

## 2019-01-07 DIAGNOSIS — E1169 Type 2 diabetes mellitus with other specified complication: Secondary | ICD-10-CM

## 2019-01-10 ENCOUNTER — Telehealth: Payer: Self-pay | Admitting: Family Medicine

## 2019-01-10 DIAGNOSIS — E119 Type 2 diabetes mellitus without complications: Secondary | ICD-10-CM | POA: Diagnosis not present

## 2019-01-10 LAB — HM DIABETES EYE EXAM

## 2019-01-10 NOTE — Telephone Encounter (Signed)
Spoke with [facility] and patient. Rosuvastatin was filled in May for 1 year worth of refills to [facility] mail order. Confirmed with patient that he wants to use [facility] order and not [facility]. No further action is needed at this time.

## 2019-01-10 NOTE — Telephone Encounter (Signed)
Best number [phone] option 3 [facility] called to get a refill  Rosuvastatin

## 2019-01-16 ENCOUNTER — Other Ambulatory Visit: Payer: Self-pay | Admitting: Infectious Diseases

## 2019-01-16 DIAGNOSIS — E1169 Type 2 diabetes mellitus with other specified complication: Secondary | ICD-10-CM

## 2019-01-25 ENCOUNTER — Telehealth: Payer: Self-pay | Admitting: *Deleted

## 2019-01-25 NOTE — Telephone Encounter (Signed)
[facility] asking for status update on prior authorization for patient's Biktarvy.  RN returned the call to get more information, as we have not received a prior authorization request.  Call was transferred multiple times, as [facility] one [facility] could identify the issue.  Patient last filled 90 day supply of Biktarvy on 01/07/2019, is not able to get another one until 03/16/2019.  No prior authorization is needed. Landis Gandy, RN

## 2019-02-01 ENCOUNTER — Encounter: Payer: Self-pay | Admitting: Family Medicine

## 2019-02-20 ENCOUNTER — Telehealth: Payer: Self-pay | Admitting: Family Medicine

## 2019-02-20 NOTE — Telephone Encounter (Signed)
Please call patient and tell him that he should be tested if he has any Covid symptoms- fever, cough, SOB, diarrhea or if he was in direct contact with someone who has tested positive and was not wearing a mask and was less than 6 feet from them for more than 10 minutes.

## 2019-02-20 NOTE — Telephone Encounter (Signed)
Patient called and stated that where he is working they have had a few positive covid cases. He would like to know if you think he should go be tested. Patient requested a call back    C/B # [phone]

## 2019-02-21 NOTE — Telephone Encounter (Signed)
LM for call back

## 2019-02-22 NOTE — Telephone Encounter (Signed)
LM x 2

## 2019-02-26 NOTE — Telephone Encounter (Signed)
ATC x 3, phone not in service, no voicemail.  Mychart message sent.  Will close this encounter

## 2019-04-04 ENCOUNTER — Other Ambulatory Visit: Payer: Self-pay | Admitting: Infectious Diseases

## 2019-04-04 DIAGNOSIS — E785 Hyperlipidemia, unspecified: Secondary | ICD-10-CM

## 2019-04-04 DIAGNOSIS — E1169 Type 2 diabetes mellitus with other specified complication: Secondary | ICD-10-CM

## 2019-04-08 ENCOUNTER — Other Ambulatory Visit: Payer: Self-pay | Admitting: Infectious Diseases

## 2019-04-08 DIAGNOSIS — E785 Hyperlipidemia, unspecified: Secondary | ICD-10-CM

## 2019-04-08 DIAGNOSIS — E1169 Type 2 diabetes mellitus with other specified complication: Secondary | ICD-10-CM

## 2019-04-16 ENCOUNTER — Other Ambulatory Visit: Payer: Self-pay | Admitting: Infectious Diseases

## 2019-04-16 ENCOUNTER — Ambulatory Visit: Payer: BLUE CROSS/BLUE SHIELD | Admitting: Infectious Diseases

## 2019-04-16 DIAGNOSIS — E1169 Type 2 diabetes mellitus with other specified complication: Secondary | ICD-10-CM

## 2019-04-16 DIAGNOSIS — E785 Hyperlipidemia, unspecified: Secondary | ICD-10-CM

## 2019-04-17 ENCOUNTER — Telehealth: Payer: Self-pay | Admitting: Urology

## 2019-04-17 ENCOUNTER — Telehealth: Payer: Self-pay | Admitting: Family Medicine

## 2019-04-17 NOTE — Telephone Encounter (Signed)
Please call patient and ask him if he has been notified by his pharmacy that his batch of medication has been recalled?

## 2019-04-17 NOTE — Telephone Encounter (Signed)
Patient stated that he read information that his metformin is being recalled due to causing cancer. He is requesting a call back if possible. He wanted to know if you are going to put him on a new medication

## 2019-04-17 NOTE — Telephone Encounter (Signed)
Please have pt scheduled for an appt as he has not been seen in over a year, thanks

## 2019-04-17 NOTE — Telephone Encounter (Signed)
Pt needs a refill for Sildenafil sent to [facility].

## 2019-04-18 NOTE — Telephone Encounter (Signed)
Pt states that he is not aware if his specific Rx/Lot # is the affected batch.  Pt is going to contact his pharmacy and speak with them to see if his Rx was affected - will call back to let us know

## 2019-04-18 NOTE — Telephone Encounter (Signed)
No refills will be given until pt has been seen in office.

## 2019-04-18 NOTE — Telephone Encounter (Signed)
I called pt to make an appointment and he states that he will call back to schedule.

## 2019-04-19 NOTE — Telephone Encounter (Signed)
Called patient and left a message for him to call the office to schedule an appt   Sharyn Lull

## 2019-04-24 ENCOUNTER — Other Ambulatory Visit: Payer: Self-pay | Admitting: *Deleted

## 2019-04-24 DIAGNOSIS — E785 Hyperlipidemia, unspecified: Secondary | ICD-10-CM

## 2019-04-24 DIAGNOSIS — E1169 Type 2 diabetes mellitus with other specified complication: Secondary | ICD-10-CM

## 2019-04-24 NOTE — Telephone Encounter (Signed)
Message was left on voicemail from [facility] that they need a refill sent in for patient for his rosuvastatin 20 mg.   Pharmacy [facility]

## 2019-04-25 NOTE — Telephone Encounter (Signed)
I spoke with patient in July about this same request and patient said at that time he is using [facility] for [facility] not [facility]. RX was refilled in May for 1 year worth of [facility]ills [facility]. Denying this request unless patient calls with different information

## 2019-05-06 ENCOUNTER — Encounter: Payer: Self-pay | Admitting: Family Medicine

## 2019-05-06 ENCOUNTER — Ambulatory Visit (INDEPENDENT_AMBULATORY_CARE_PROVIDER_SITE_OTHER): Payer: BC Managed Care – PPO | Admitting: Family Medicine

## 2019-05-06 ENCOUNTER — Other Ambulatory Visit: Payer: Self-pay

## 2019-05-06 VITALS — BP 160/74 | HR 80 | Temp 98.4°F | Resp 16 | Ht 68.0 in | Wt 179.0 lb

## 2019-05-06 DIAGNOSIS — E1169 Type 2 diabetes mellitus with other specified complication: Secondary | ICD-10-CM

## 2019-05-06 DIAGNOSIS — N5201 Erectile dysfunction due to arterial insufficiency: Secondary | ICD-10-CM | POA: Diagnosis not present

## 2019-05-06 DIAGNOSIS — L03115 Cellulitis of right lower limb: Secondary | ICD-10-CM | POA: Diagnosis not present

## 2019-05-06 DIAGNOSIS — E785 Hyperlipidemia, unspecified: Secondary | ICD-10-CM

## 2019-05-06 MED ORDER — CEPHALEXIN 500 MG PO CAPS: 500.0000 mg | ORAL_CAPSULE | Freq: Three times a day (TID) | ORAL | 0 refills | Status: DC

## 2019-05-06 MED ORDER — ROSUVASTATIN CALCIUM 20 MG PO TABS: 20.0000 mg | ORAL_TABLET | Freq: Every day | ORAL | 3 refills | Status: DC

## 2019-05-06 NOTE — Progress Notes (Signed)
Subjective:    Patient ID: Clinton Watts, male    DOB: 20-Jul-1955, 63 y.o.   MRN: [id number]  HPI Chief Complaint  Patient presents with  . Leg Swelling    right side. noticed it on 05/05/2019. some pain with walking,   This is a 63 yo male who presents today with above cc. He has had 2 days of right sided lower leg swelling and pain.  He does not recall any injury that he works at [employer] and thinks it is possible that he hit his lower leg on something.  He denies any fever, chills, generalized achiness.  He has not taken anything for pain. Diabetes mellitus-reports that his blood sugars have been well controlled.  He is overdue for hemoglobin A1c.  He reports that he tolerated increase in metformin without any stomach upset or excessive diarrhea. ED-he was seen at urology.  He requests refill of sildenafil today.  According to urology note, he is overdue for follow-up.  Past Medical History:  Diagnosis Date  . Diabetes mellitus without complication (HCC)   . HIV (human immunodeficiency virus infection) (HCC) 10/12/2017  . Hyperlipidemia    Past Surgical History:  Procedure Laterality Date  . HERNIA REPAIR     Family History  Problem Relation Age of Onset  . Diabetes Mother   . Stomach cancer Sister   . Pancreatic cancer Brother   . Prostate cancer Neg Hx   . Bladder Cancer Neg Hx   . Kidney cancer Neg Hx    Social History   Tobacco Use  . Smoking status: Never Smoker  . Smokeless tobacco: Never Used  Substance Use Topics  . Alcohol use: No    Frequency: Never  . Drug use: No      Review of Systems Per HPI    Objective:   Physical Exam Vitals signs reviewed.  Constitutional:      General: He is not in acute distress.    Appearance: Normal appearance. He is normal weight. He is not ill-appearing, toxic-appearing or diaphoretic.  HENT:     Head: Normocephalic and atraumatic.  Cardiovascular:     Rate and Rhythm: Normal rate.  Pulmonary:     Effort:  Pulmonary effort is normal.  Musculoskeletal:     Comments: Normal gait.  No right calf tenderness.  Skin:    General: Skin is warm and dry.       Neurological:     Mental Status: He is alert and oriented to person, place, and time.          BP (!) 160/74   Pulse 80   Temp 98.4 F (36.9 C)   Resp 16   Ht 5\' 8"  (1.727 m)   Wt 179 lb (81.2 kg)   SpO2 96%   BMI 27.22 kg/m  Wt Readings from Last 3 Encounters:  05/06/19 179 lb (81.2 kg)  11/12/18 175 lb (79.4 kg)  03/01/18 178 lb (80.7 kg)   BP Readings from Last 3 Encounters:  05/06/19 (!) 160/74  11/12/18 (!) 154/88  03/01/18 132/80    Assessment & Plan:  1. Hyperlipidemia associated with type 2 diabetes mellitus (HCC) -Requests refill of rosuvastatin - rosuvastatin (CRESTOR) 20 MG tablet; Take 1 tablet (20 mg total) by mouth daily.  Dispense: 90 tablet; Refill: 3 - Lipid Panel - Hemoglobin A1c  2. Erectile dysfunction due to arterial insufficiency -Per urology note, he is overdue follow-up.  Explained to patient that I am unable to refill his  medication and he needs to follow-up with urology.  3. Cellulitis of right lower extremity -Discussed diagnosis and treatment with patient. -Return to clinic instructions reviewed, if no improvement in 48 hours or if worsening or if fever/chills - cephALEXin (KEFLEX) 500 MG capsule; Take 1 capsule (500 mg total) by mouth 3 (three) times daily.  Dispense: 21 capsule; Refill: 0  -Follow-up in 3 months for complete physical Clarene Reamer, FNP-BC  [facility] [place]  05/06/2019 5:17 PM

## 2019-05-06 NOTE — Patient Instructions (Addendum)
Follow up in 3 months for your annual exam  Let me know if not better in 2 days or if worse   Cellulitis, Adult  Cellulitis is a skin infection. The infected area is often warm, red, swollen, and sore. It occurs most often in the arms and lower legs. It is very important to get treated for this condition. What are the causes? This condition is caused by bacteria. The bacteria enter through a break in the skin, such as a cut, burn, insect bite, open sore, or crack. What increases the risk? This condition is more likely to occur in people who:  Have a weak body defense system (immune system).  Have open cuts, burns, bites, or scrapes on the skin.  Are older than 65 years of age.  Have a blood sugar problem (diabetes).  Have a long-lasting (chronic) liver disease (cirrhosis) or kidney disease.  Are very overweight (obese).  Have a skin problem, such as: ? Itchy rash (eczema). ? Slow movement of blood in the veins (venous stasis). ? Fluid buildup below the skin (edema).  Have been treated with high-energy rays (radiation).  Use IV drugs. What are the signs or symptoms? Symptoms of this condition include:  Skin that is: ? Red. ? Streaking. ? Spotting. ? Swollen. ? Sore or painful when you touch it. ? Warm.  A fever.  Chills.  Blisters. How is this diagnosed? This condition is diagnosed based on:  Medical history.  Physical exam.  Blood tests.  Imaging tests. How is this treated? Treatment for this condition may include:  Medicines to treat infections or allergies.  Home care, such as: ? Rest. ? Placing cold or warm cloths (compresses) on the skin.  Hospital care, if the condition is very bad. Follow these instructions at home: Medicines  Take over-the-counter and prescription medicines only as told by your doctor.  If you were prescribed an antibiotic medicine, take it as told by your doctor. Do not stop taking it even if you start to feel  better. General instructions   Drink enough fluid to keep your pee (urine) pale yellow.  Do not touch or rub the infected area.  Raise (elevate) the infected area above the level of your heart while you are sitting or lying down.  Place cold or warm cloths on the area as told by your doctor.  Keep all follow-up visits as told by your doctor. This is important. Contact a doctor if:  You have a fever.  You do not start to get better after 1-2 days of treatment.  Your bone or joint under the infected area starts to hurt after the skin has healed.  Your infection comes back. This can happen in the same area or another area.  You have a swollen bump in the area.  You have new symptoms.  You feel ill and have muscle aches and pains. Get help right away if:  Your symptoms get worse.  You feel very sleepy.  You throw up (vomit) or have watery poop (diarrhea) for a long time.  You see red streaks coming from the area.  Your red area gets larger.  Your red area turns dark in color. These symptoms may represent a serious problem that is an emergency. Do not wait to see if the symptoms will go away. Get medical help right away. Call your local emergency services (911 in the [place] Do not drive yourself to the hospital. Summary  Cellulitis is a skin infection. The area is  often warm, red, swollen, and sore.  This condition is treated with medicines, rest, and cold and warm cloths.  Take all medicines only as told by your doctor.  Tell your doctor if symptoms do not start to get better after 1-2 days of treatment. This information is not intended to replace advice given to you by your health care provider. Make sure you discuss any questions you have with your health care provider. Document Released: 12/07/2007 Document Revised: 11/09/2017 Document Reviewed: 11/09/2017 Elsevier Patient Education  2020 [employer].

## 2019-05-07 LAB — LIPID PANEL
Cholesterol: 266 mg/dL — ABNORMAL HIGH (ref 0–200)
HDL: 32.3 mg/dL — ABNORMAL LOW (ref >39.00)
Total CHOL/HDL Ratio: 8
Triglycerides: 511 mg/dL — ABNORMAL HIGH (ref 0.0–149.0)

## 2019-05-07 LAB — HEMOGLOBIN A1C: Hgb A1c MFr Bld: 11.2 % — ABNORMAL HIGH (ref 4.6–6.5)

## 2019-05-07 LAB — LDL CHOLESTEROL, DIRECT: Direct LDL: 119 mg/dL

## 2019-05-09 ENCOUNTER — Telehealth: Payer: Self-pay | Admitting: Family Medicine

## 2019-05-09 NOTE — Telephone Encounter (Signed)
Patient advised. See result note.

## 2019-05-09 NOTE — Telephone Encounter (Signed)
Patient called you back in regards to his labs

## 2019-05-13 ENCOUNTER — Other Ambulatory Visit: Payer: Self-pay | Admitting: Family Medicine

## 2019-05-13 DIAGNOSIS — E119 Type 2 diabetes mellitus without complications: Secondary | ICD-10-CM

## 2019-05-13 MED ORDER — GLIPIZIDE ER 5 MG PO TB24: 5.0000 mg | ORAL_TABLET | Freq: Every day | ORAL | 1 refills | Status: DC

## 2019-05-13 NOTE — Progress Notes (Signed)
Rx for glipizide xl sent to patient's pharmacy.

## 2019-05-22 ENCOUNTER — Encounter: Payer: Self-pay | Admitting: Family Medicine

## 2019-05-22 NOTE — Telephone Encounter (Signed)
Message sent to patient:  Mr. Sitter,   Please contact the office for an update to your medications - there have been changes recommended by Jackelyn Poling that we need to discuss with you regarding your Cholesterol medication.   Please call the office to discuss your results.   Notes recorded by Elby Beck, FNP on 05/13/2019 at 11:19 AM EST  Follow up to previous note-  Needs to increase his rosuvastatin. Take one tablet for a week then increase to two since he has been out.  I have sent in an additional diabetes medication to his pharmacy, [facility], which he is to take with breakfast.  It is important that he carefully watch his diet and avoid simple carbs (breads, pasta, chips, crackers, etc), fast food and drinks with calories (juice, soda, sweet tea).  Please schedule him for a follow up visit for 1 months and remind him to bring his blood sugar monitor or a log of his readings.     Thanks!

## 2019-05-24 ENCOUNTER — Encounter: Payer: Self-pay | Admitting: Family Medicine

## 2019-06-29 IMAGING — CT CT ABD-PELV W/ CM
2 of 5 series · 16 of 46 positions shown, 18 images · IV contrast (APPLIED)
Comparison: None.

CLINICAL DATA: Generalized abdominal pain.  Fever.  HIV

EXAM:
CT ABDOMEN AND PELVIS WITH CONTRAST
TECHNIQUE: Multidetector CT imaging of the abdomen and pelvis was performed
using the standard protocol following bolus administration of
intravenous contrast.
CONTRAST:  100mL OMNIPAQUE IOHEXOL 300 MG/ML  SOLN

[Series 2: routine abd/pel with · axial · 0.72mm/px · z∈[-555,-105]mm · 13 of 102 slices shown, 15 images]
[im 6/102  soft-tissue]
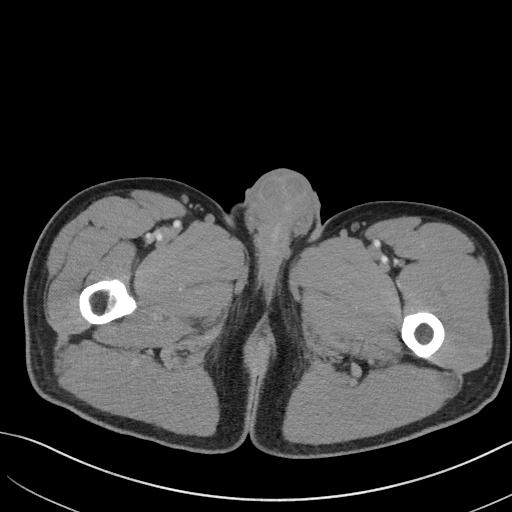
[im 6/102  bone]
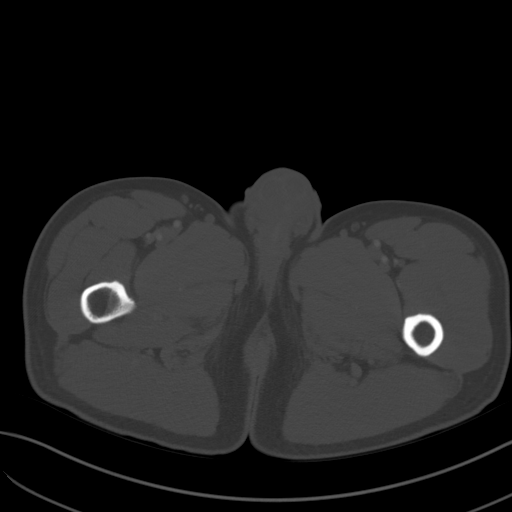
[im 16/102  soft-tissue]
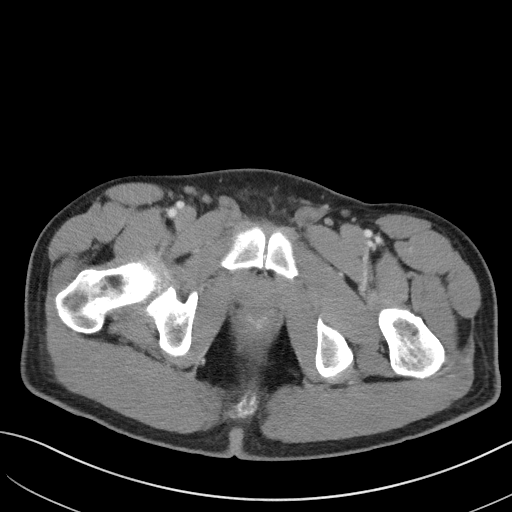
[im 21/102  soft-tissue]
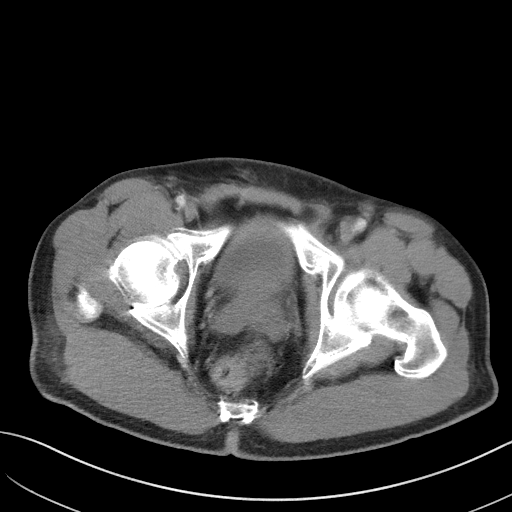
[im 31/102  soft-tissue]
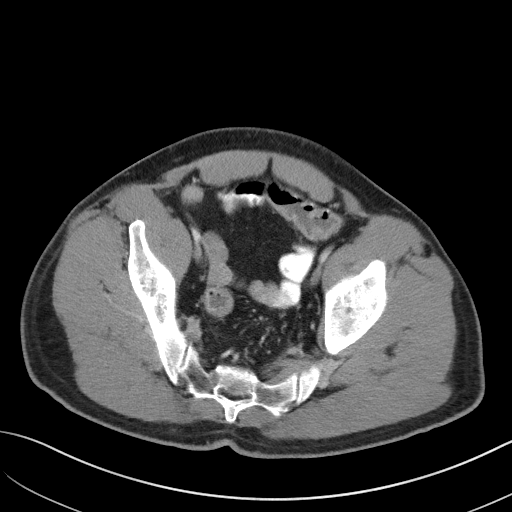
[im 36/102  soft-tissue]
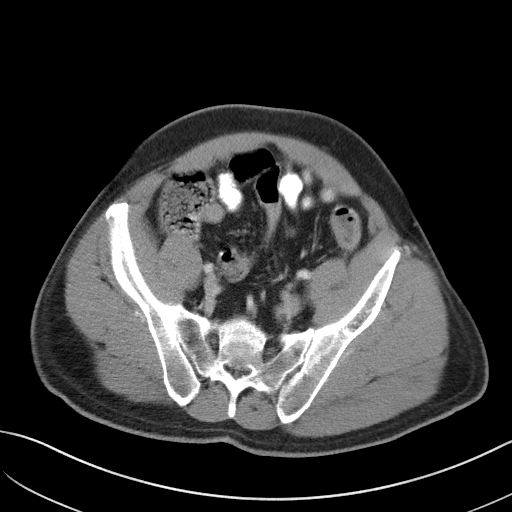
[im 46/102  soft-tissue]
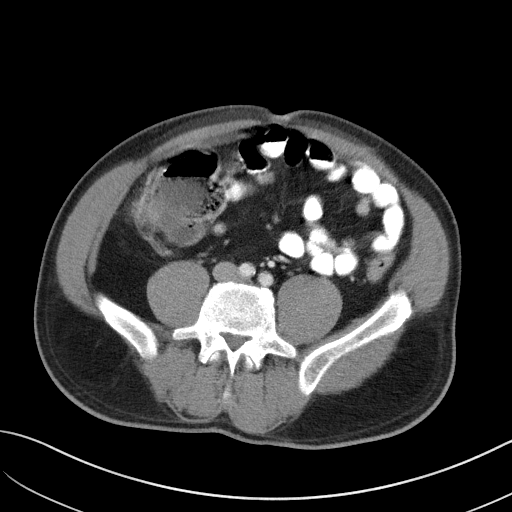
[im 51/102  soft-tissue]
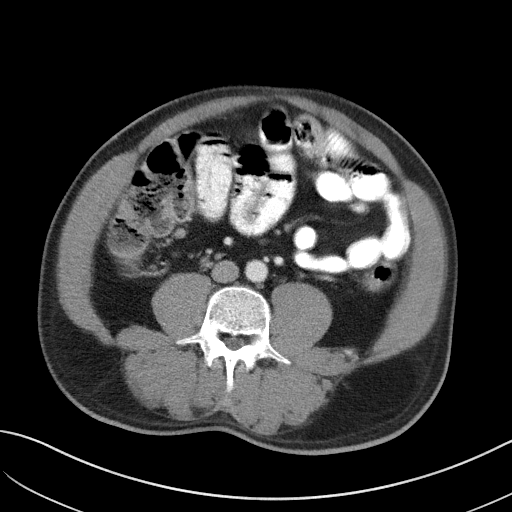
[im 56/102  soft-tissue]
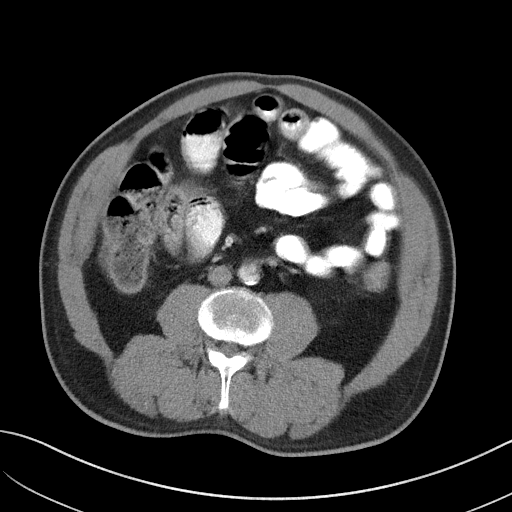
[im 66/102  soft-tissue]
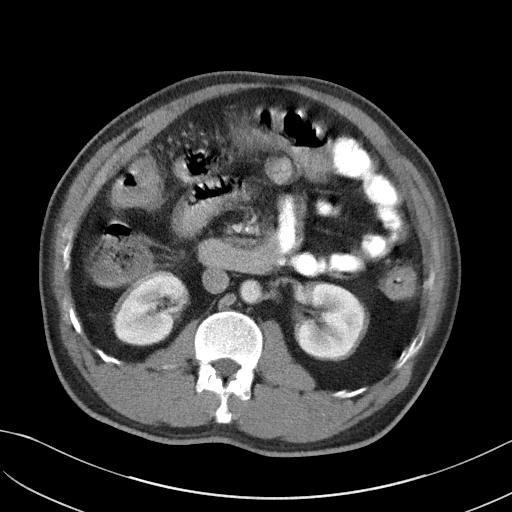
[im 66/102  bone]
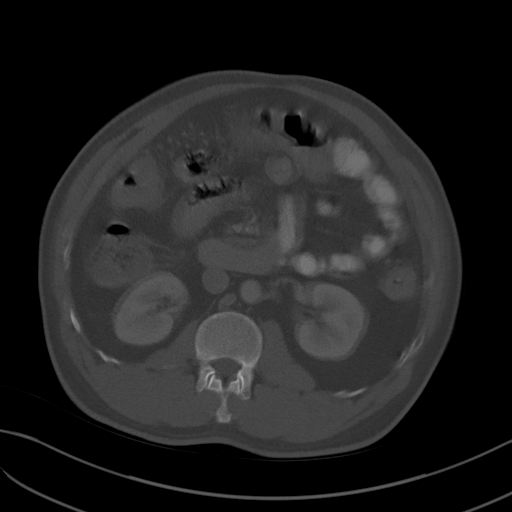
[im 71/102  soft-tissue]
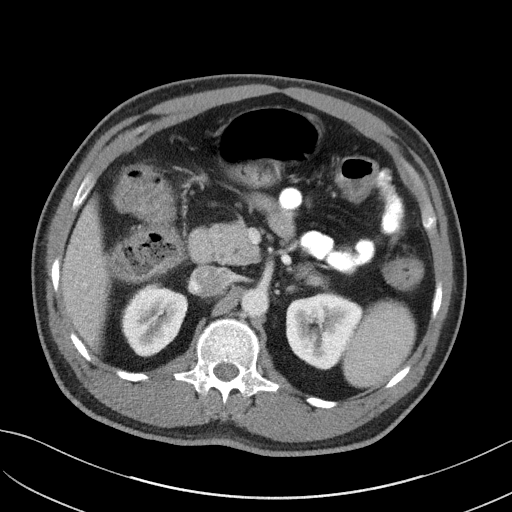
[im 81/102  soft-tissue]
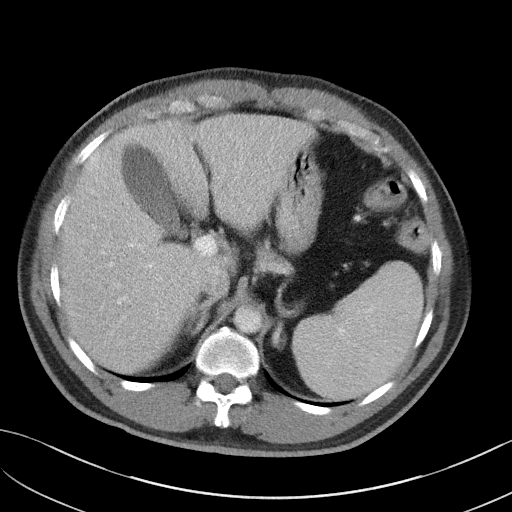
[im 86/102  soft-tissue]
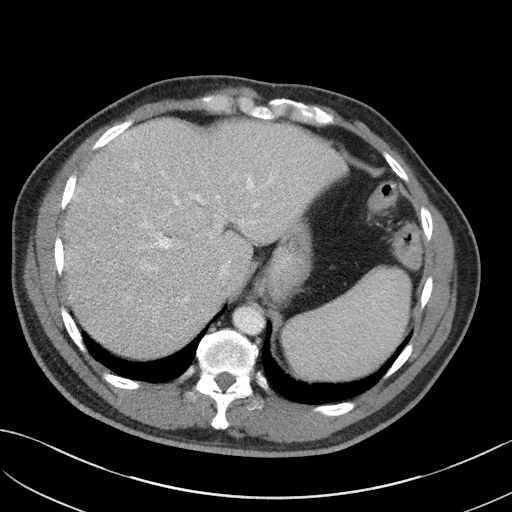
[im 96/102  soft-tissue]
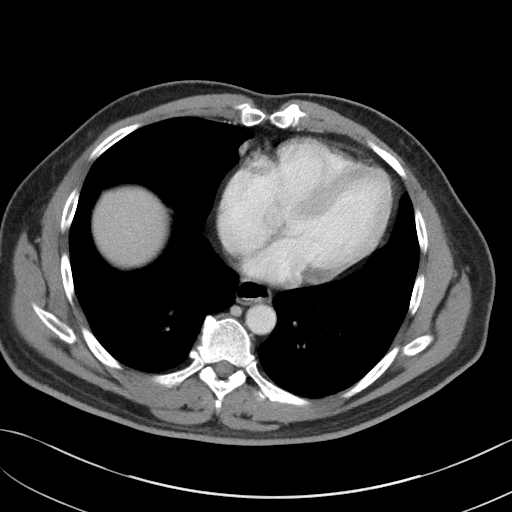

[Series 5: coronal st · coronal · 0.74mm/px · 3 of 97 slices shown]
[im 33/97  soft-tissue]
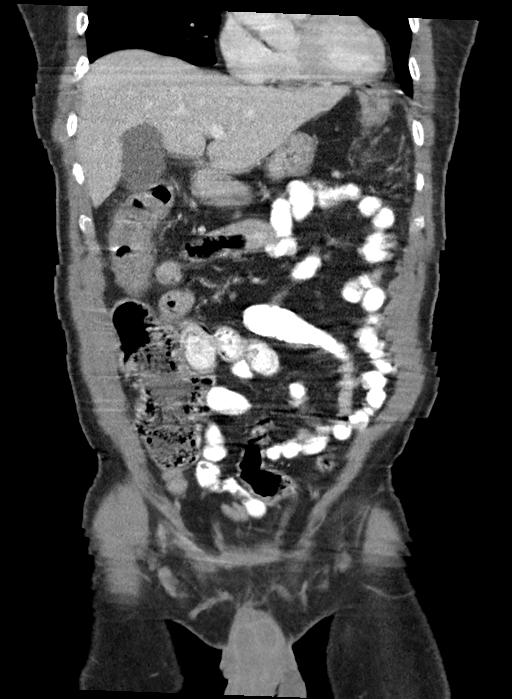
[im 43/97  soft-tissue]
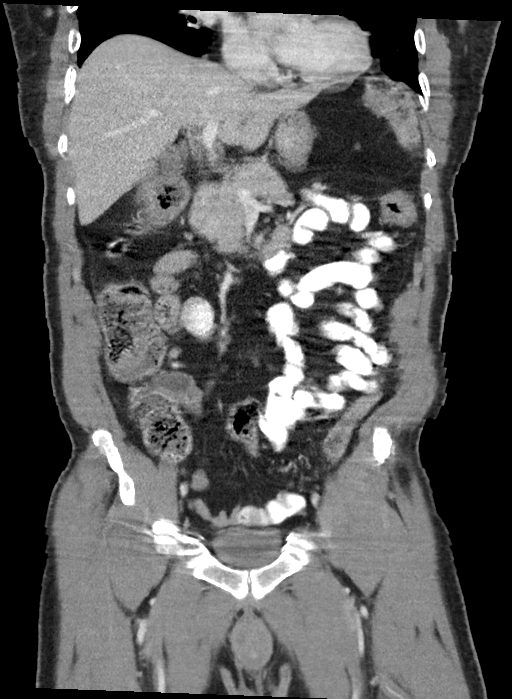
[im 54/97  soft-tissue]
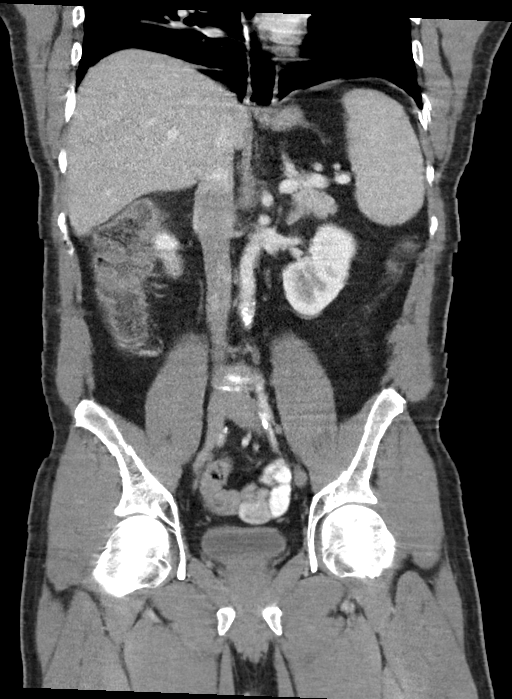

[16 of 46 positions shown; findings below may reference images not displayed]

FINDINGS: Lower chest: Lung bases clear

Image quality degraded by motion

Hepatobiliary: Normal liver.  Gallbladder and bile ducts normal.

Pancreas: Negative

Spleen: Negative

Adrenals/Urinary Tract: Negative

Stomach/Bowel: Normal stomach. Mild small bowel dilatation without
thickening. Diffuse mucosal edema throughout the colon most
prominent in the transverse colon where there is marked mucosal
edema. Colon is nondilated. Rectum is mildly thickened. Negative for
pneumatosis

Vascular/Lymphatic: Mild atherosclerotic disease

Reproductive: Normal prostate size

Other: No free fluid or abscess.

Musculoskeletal: Negative
IMPRESSION: Diffuse colonic thickening compatible with colitis. No evidence of
perforation or abscess. Mild small bowel dilatation may be due to
partial obstruction or ileus.

Image quality degraded by motion.

## 2019-07-10 ENCOUNTER — Ambulatory Visit: Payer: BC Managed Care – PPO | Admitting: Urology

## 2019-07-10 ENCOUNTER — Other Ambulatory Visit: Payer: Self-pay

## 2019-07-10 ENCOUNTER — Encounter: Payer: Self-pay | Admitting: Urology

## 2019-07-10 VITALS — BP 167/76 | HR 88 | Ht 68.0 in | Wt 176.1 lb

## 2019-07-10 DIAGNOSIS — N5201 Erectile dysfunction due to arterial insufficiency: Secondary | ICD-10-CM | POA: Diagnosis not present

## 2019-07-10 DIAGNOSIS — E291 Testicular hypofunction: Secondary | ICD-10-CM | POA: Diagnosis not present

## 2019-07-10 MED ORDER — TADALAFIL 20 MG PO TABS: ORAL_TABLET | ORAL | 0 refills | Status: DC

## 2019-07-10 NOTE — Progress Notes (Signed)
07/10/2019 1:51 PM   Clinton Watts [birth year] [id number]  Referring provider: Emi Belfast, FNP [address] [place],  [place] [address]  Chief Complaint  Patient presents with  . Erectile Dysfunction    HPI: 64 y.o. male presents for follow-up of erectile dysfunction.  I last saw him June 2019.  At that time 100 g of generic sildenafil was effective.  His testosterone was slightly low at 236 ng/dL.  He wanted to hold off on TRT.  He states the sildenafil is still effective but it does not last as long as he would like.  SHIM completed today was 11/25 indicating moderate ED.  He wanted to discuss other ED options.   PMH: Past Medical History:  Diagnosis Date  . Diabetes mellitus without complication (HCC)   . ED (erectile dysfunction)   . HIV (human immunodeficiency virus infection) (HCC) 10/12/2017  . Hyperlipidemia     Surgical History: Past Surgical History:  Procedure Laterality Date  . HERNIA REPAIR      Home Medications:  Allergies as of 07/10/2019   No Known Allergies     Medication List       Accurate as of July 10, 2019  1:51 PM. If you have any questions, ask your nurse or doctor.        STOP taking these medications   cephALEXin 500 MG capsule Commonly known as: KEFLEX Stopped by: [name], MD     TAKE these medications   albuterol 108 (90 Base) MCG/ACT inhaler Commonly known as: VENTOLIN HFA   bictegravir-emtricitabine-tenofovir AF 50-200-25 MG Tabs tablet Commonly known as: Biktarvy Take 1 tablet by mouth daily.   glipiZIDE 5 MG 24 hr tablet Commonly known as: GLUCOTROL XL Take 1 tablet (5 mg total) by mouth daily with breakfast.   metFORMIN 1000 MG tablet Commonly known as: GLUCOPHAGE Take 1 tablet (1,000 mg total) by mouth 2 (two) times daily with a meal.   rosuvastatin 20 MG tablet Commonly known as: CRESTOR Take 1 tablet (20 mg total) by mouth daily.   sildenafil 20 MG tablet Commonly known as: REVATIO 2-5  tabs 1 hour prior to intercourse       Allergies: No Known Allergies  Family History: Family History  Problem Relation Age of Onset  . Diabetes Mother   . Stomach cancer Sister   . Pancreatic cancer Brother   . Prostate cancer Neg Hx   . Bladder Cancer Neg Hx   . Kidney cancer Neg Hx     Social History:  reports that he has never smoked. He has never used smokeless tobacco. He reports that he does not drink alcohol or use drugs.  ROS: UROLOGY Frequent Urination?: No Hard to postpone urination?: No Burning/pain with urination?: No Get up at night to urinate?: No Leakage of urine?: No Urine stream starts and stops?: No Trouble starting stream?: No Do you have to strain to urinate?: No Blood in urine?: No Urinary tract infection?: No Sexually transmitted disease?: No Injury to kidneys or bladder?: No Painful intercourse?: No Weak stream?: No Erection problems?: Yes Penile pain?: No  Gastrointestinal Nausea?: No Vomiting?: No Indigestion/heartburn?: No Diarrhea?: No Constipation?: No  Constitutional Fever: No Night sweats?: No Weight loss?: No Fatigue?: No  Skin Skin rash/lesions?: No Itching?: No  Eyes Blurred vision?: No Double vision?: No  Ears/Nose/Throat Sore throat?: No Sinus problems?: No  Hematologic/Lymphatic Swollen glands?: No Easy bruising?: No  Cardiovascular Leg swelling?: No Chest pain?: No  Respiratory Cough?:  No Shortness of breath?: No  Endocrine Excessive thirst?: No  Musculoskeletal Back pain?: No Joint pain?: No  Neurological Headaches?: No Dizziness?: No  Psychologic Depression?: No Anxiety?: No  Physical Exam: BP (!) 167/76 (BP Location: Left Arm, Patient Position: Sitting, Cuff Size: Normal)   Pulse 88   Ht 5\' 8"  (1.727 m)   Wt 176 lb 1.6 oz (79.9 kg)   BMI 26.78 kg/m   Constitutional:  Alert and oriented, No acute distress. HEENT: [place] AT, moist mucus membranes.  Trachea midline, no  masses. Cardiovascular: No clubbing, cyanosis, or edema. Respiratory: Normal respiratory effort, no increased work of breathing. Skin: No rashes, bruises or suspicious lesions. Neurologic: Grossly intact, no focal deficits, moving all 4 extremities. Psychiatric: Normal mood and affect.   Assessment & Plan:    - Erectile dysfunction Worsening.  He was interested in a trial of generic tadalafil and Rx was sent to his pharmacy.  Other options were discussed including intracavernosal injections.  - Hypogonadism Will schedule a morning lab visit for testosterone.  If his level is still low TRT may improve the efficacy of PDE 5 inhibitor.  - Prostate cancer screening Last PSA was June 2019 and will order   Abbie Sons, [place] [address] [place], [place] [address] [phone]

## 2019-07-12 ENCOUNTER — Other Ambulatory Visit: Payer: Self-pay | Admitting: Infectious Diseases

## 2019-07-12 DIAGNOSIS — E785 Hyperlipidemia, unspecified: Secondary | ICD-10-CM

## 2019-07-12 DIAGNOSIS — E1169 Type 2 diabetes mellitus with other specified complication: Secondary | ICD-10-CM

## 2019-07-13 ENCOUNTER — Encounter: Payer: Self-pay | Admitting: Urology

## 2019-07-13 DIAGNOSIS — E291 Testicular hypofunction: Secondary | ICD-10-CM | POA: Insufficient documentation

## 2019-07-18 ENCOUNTER — Other Ambulatory Visit: Payer: Self-pay

## 2019-07-18 DIAGNOSIS — E291 Testicular hypofunction: Secondary | ICD-10-CM

## 2019-07-19 ENCOUNTER — Other Ambulatory Visit: Payer: BC Managed Care – PPO

## 2019-07-19 ENCOUNTER — Other Ambulatory Visit: Payer: Self-pay

## 2019-07-19 DIAGNOSIS — E291 Testicular hypofunction: Secondary | ICD-10-CM | POA: Diagnosis not present

## 2019-07-20 LAB — LUTEINIZING HORMONE: LH: 6.5 m[IU]/mL (ref 1.7–8.6)

## 2019-07-20 LAB — PSA: Prostate Specific Ag, Serum: 0.7 ng/mL (ref 0.0–4.0)

## 2019-07-20 LAB — TESTOSTERONE: Testosterone: 171 ng/dL — ABNORMAL LOW (ref 264–916)

## 2019-07-22 ENCOUNTER — Telehealth: Payer: Self-pay | Admitting: Urology

## 2019-07-22 NOTE — Telephone Encounter (Signed)
Testosterone level lower at 171.  PSA was 0.7.  Although starting testosterone replacement will not resolve his ED it may make sildenafil or tadalafil more effective.

## 2019-07-29 ENCOUNTER — Other Ambulatory Visit: Payer: Self-pay

## 2019-07-29 ENCOUNTER — Telehealth (INDEPENDENT_AMBULATORY_CARE_PROVIDER_SITE_OTHER): Payer: BC Managed Care – PPO | Admitting: Urology

## 2019-07-29 NOTE — Progress Notes (Signed)
Mr. Clinton Watts was on the schedule today for a virtual visit.  I was unable to get in touch with him and he called back requesting to reschedule as he was at work.

## 2019-07-30 ENCOUNTER — Other Ambulatory Visit: Payer: Self-pay

## 2019-07-30 ENCOUNTER — Telehealth (INDEPENDENT_AMBULATORY_CARE_PROVIDER_SITE_OTHER): Payer: BC Managed Care – PPO | Admitting: Urology

## 2019-07-30 DIAGNOSIS — N5201 Erectile dysfunction due to arterial insufficiency: Secondary | ICD-10-CM

## 2019-07-30 DIAGNOSIS — E291 Testicular hypofunction: Secondary | ICD-10-CM

## 2019-07-30 MED ORDER — TADALAFIL 20 MG PO TABS: ORAL_TABLET | ORAL | 3 refills | Status: DC

## 2019-07-30 NOTE — Progress Notes (Signed)
Virtual Visit via Telephone Note  I connected with Clinton Watts on 07/30/19 at  8:30 AM EST by telephone and verified that I am speaking with the correct person using two identifiers.  Location: Patient: Home Provider: Office-[facility]   I discussed the limitations, risks, security and privacy concerns of performing an evaluation and management service by telephone and the availability of in person appointments. I also discussed with the patient that there may be a patient responsible charge related to this service. The patient expressed understanding and agreed to proceed.   History of Present Illness: Clinton Watts was seen on 07/10/2019 in follow-up for ED.  He has a history of low testosterone and has declined treatment.  At our last visit he elected a trial of generic tadalafil and he does feel this works better than generic sildenafil.  His erections have improved and last longer.  He states he has been able to have satisfactory intercourse.  His testosterone level has decreased to 170.  He has no tiredness or fatigue and states his libido is okay.  PSA stable at 0.7.  LH was midrange normal.   Observations/Objective: N/A  Assessment and Plan: -Erectile dysfunction He requested a refill of tadalafil and Rx was sent to pharmacy.  -Hypogonadism We discussed TRT including potential side effects and monitoring.  He does not desire to start testosterone replacement at this time.  He will continue annual follow-up and will call for an earlier appointment if he notes any significant changes.  Follow Up Instructions: 1 year   I discussed the assessment and treatment plan with the patient. The patient was provided an opportunity to ask questions and all were answered. The patient agreed with the plan and demonstrated an understanding of the instructions.   The patient was advised to call back or seek an in-person evaluation if the symptoms worsen or if the condition fails to  improve as anticipated.  I provided 12 minutes of non-face-to-face time during this encounter.   [name], MD

## 2019-08-05 ENCOUNTER — Other Ambulatory Visit: Payer: Self-pay

## 2019-08-05 ENCOUNTER — Ambulatory Visit: Payer: BC Managed Care – PPO

## 2019-08-05 ENCOUNTER — Other Ambulatory Visit: Payer: BC Managed Care – PPO

## 2019-08-05 DIAGNOSIS — B20 Human immunodeficiency virus [HIV] disease: Secondary | ICD-10-CM

## 2019-08-05 DIAGNOSIS — Z79899 Other long term (current) drug therapy: Secondary | ICD-10-CM

## 2019-08-05 DIAGNOSIS — Z113 Encounter for screening for infections with a predominantly sexual mode of transmission: Secondary | ICD-10-CM

## 2019-08-05 NOTE — Progress Notes (Signed)
b20

## 2019-08-12 ENCOUNTER — Other Ambulatory Visit (HOSPITAL_COMMUNITY)
Admission: RE | Admit: 2019-08-12 | Discharge: 2019-08-12 | Disposition: A | Discharge status: Home / Self Care | Payer: BC Managed Care – PPO | Source: Ambulatory Visit | Attending: Infectious Diseases | Admitting: Infectious Diseases

## 2019-08-12 ENCOUNTER — Other Ambulatory Visit: Payer: Self-pay

## 2019-08-12 ENCOUNTER — Other Ambulatory Visit: Payer: BC Managed Care – PPO

## 2019-08-12 ENCOUNTER — Ambulatory Visit: Payer: BC Managed Care – PPO

## 2019-08-12 DIAGNOSIS — Z113 Encounter for screening for infections with a predominantly sexual mode of transmission: Secondary | ICD-10-CM | POA: Insufficient documentation

## 2019-08-12 DIAGNOSIS — Z79899 Other long term (current) drug therapy: Secondary | ICD-10-CM

## 2019-08-12 DIAGNOSIS — B20 Human immunodeficiency virus [HIV] disease: Secondary | ICD-10-CM | POA: Diagnosis not present

## 2019-08-13 LAB — URINE CYTOLOGY ANCILLARY ONLY
Chlamydia: NEGATIVE
Comment: NEGATIVE
Comment: NORMAL
Neisseria Gonorrhea: NEGATIVE

## 2019-08-13 LAB — T-HELPER CELL (CD4) - (RCID CLINIC ONLY)
CD4 % Helper T Cell: 24 % — ABNORMAL LOW (ref 33–65)
CD4 T Cell Abs: 502 /uL (ref 400–1790)

## 2019-08-15 LAB — CBC WITH DIFFERENTIAL/PLATELET
Absolute Monocytes: 539 cells/uL (ref 200–950)
Basophils Absolute: 28 cells/uL (ref 0–200)
Basophils Relative: 0.4 %
Eosinophils Absolute: 98 cells/uL (ref 15–500)
Eosinophils Relative: 1.4 %
HCT: 39.1 % (ref 38.5–50.0)
Hemoglobin: 12.4 g/dL — ABNORMAL LOW (ref 13.2–17.1)
Lymphs Abs: 2170 cells/uL (ref 850–3900)
MCH: 21.3 pg — ABNORMAL LOW (ref 27.0–33.0)
MCHC: 31.7 g/dL — ABNORMAL LOW (ref 32.0–36.0)
MCV: 67.2 fL — ABNORMAL LOW (ref 80.0–100.0)
MPV: 11.1 fL (ref 7.5–12.5)
Monocytes Relative: 7.7 %
Neutro Abs: 4165 cells/uL (ref 1500–7800)
Neutrophils Relative %: 59.5 %
Platelets: 204 10*3/uL (ref 140–400)
RBC: 5.82 10*6/uL — ABNORMAL HIGH (ref 4.20–5.80)
RDW: 17.3 % — ABNORMAL HIGH (ref 11.0–15.0)
Total Lymphocyte: 31 %
WBC: 7 10*3/uL (ref 3.8–10.8)

## 2019-08-15 LAB — LIPID PANEL
Cholesterol: 117 mg/dL (ref <200)
HDL: 27 mg/dL — ABNORMAL LOW (ref >=40)
LDL Cholesterol (Calc): 51 mg/dL (calc)
Non-HDL Cholesterol (Calc): 90 mg/dL (calc) (ref <130)
Total CHOL/HDL Ratio: 4.3 (calc) (ref <5.0)
Triglycerides: 368 mg/dL — ABNORMAL HIGH (ref <150)

## 2019-08-15 LAB — COMPREHENSIVE METABOLIC PANEL
AG Ratio: 1.6 (calc) (ref 1.0–2.5)
ALT: 18 U/L (ref 9–46)
AST: 23 U/L (ref 10–35)
Albumin: 4.5 g/dL (ref 3.6–5.1)
Alkaline phosphatase (APISO): 76 U/L (ref 35–144)
BUN: 14 mg/dL (ref 7–25)
CO2: 28 mmol/L (ref 20–32)
Calcium: 9.9 mg/dL (ref 8.6–10.3)
Chloride: 99 mmol/L (ref 98–110)
Creat: 1 mg/dL (ref 0.70–1.25)
Globulin: 2.9 g/dL (calc) (ref 1.9–3.7)
Glucose, Bld: 308 mg/dL — ABNORMAL HIGH (ref 65–99)
Potassium: 4.7 mmol/L (ref 3.5–5.3)
Sodium: 134 mmol/L — ABNORMAL LOW (ref 135–146)
Total Bilirubin: 0.4 mg/dL (ref 0.2–1.2)
Total Protein: 7.4 g/dL (ref 6.1–8.1)

## 2019-08-15 LAB — RPR: RPR Ser Ql: NONREACTIVE

## 2019-08-15 LAB — HIV-1 RNA QUANT-NO REFLEX-BLD
HIV 1 RNA Quant: <20 copies/mL
HIV-1 RNA Quant, Log: <1.3 Log copies/mL

## 2019-08-20 ENCOUNTER — Encounter: Payer: BC Managed Care – PPO | Admitting: Infectious Diseases

## 2019-09-09 ENCOUNTER — Other Ambulatory Visit: Payer: Self-pay

## 2019-09-09 ENCOUNTER — Ambulatory Visit (INDEPENDENT_AMBULATORY_CARE_PROVIDER_SITE_OTHER): Payer: BC Managed Care – PPO | Admitting: Infectious Diseases

## 2019-09-09 ENCOUNTER — Encounter: Payer: Self-pay | Admitting: Infectious Diseases

## 2019-09-09 VITALS — BP 182/79 | HR 77 | Temp 97.5°F | Ht 68.0 in | Wt 179.0 lb

## 2019-09-09 DIAGNOSIS — Z21 Asymptomatic human immunodeficiency virus [HIV] infection status: Secondary | ICD-10-CM | POA: Diagnosis not present

## 2019-09-09 DIAGNOSIS — I10 Essential (primary) hypertension: Secondary | ICD-10-CM

## 2019-09-09 DIAGNOSIS — E119 Type 2 diabetes mellitus without complications: Secondary | ICD-10-CM

## 2019-09-09 DIAGNOSIS — Z113 Encounter for screening for infections with a predominantly sexual mode of transmission: Secondary | ICD-10-CM

## 2019-09-09 DIAGNOSIS — Z0000 Encounter for general adult medical examination without abnormal findings: Secondary | ICD-10-CM

## 2019-09-09 DIAGNOSIS — B20 Human immunodeficiency virus [HIV] disease: Secondary | ICD-10-CM

## 2019-09-09 DIAGNOSIS — Z79899 Other long term (current) drug therapy: Secondary | ICD-10-CM | POA: Diagnosis not present

## 2019-09-09 NOTE — Progress Notes (Signed)
Name: Clinton Watts  MEQ:[id number]   DOB: 05/10/1956   PCP: Elby Beck, FNP   Patient Active Problem List   Diagnosis Date Noted  . Erectile dysfunction due to arterial insufficiency 07/30/2019  . Hypogonadism in male 07/13/2019  . Elevated blood pressure reading 01/22/2018  . Human immunodeficiency virus (HIV) disease ([place]) 10/12/2017  . Healthcare maintenance 10/12/2017  . Erectile dysfunction 10/12/2017  . History of hernia repair 08/26/2017  . Type 2 diabetes mellitus without complication, without long-term current use of insulin ([place]) 08/26/2017  . Hyperlipidemia associated with type 2 diabetes mellitus ([place]) 08/26/2017     Chief Complaint: Follow up HIV care.     Subjective:   Clinton Watts is a 64 y.o. AA male with HIV disease Dx 81. Originally from [place].  HIV Risk: heterosexual contact. Previously in care with [facility] in [place] [phone]).  Hx OIs: none known   Previous Regimens:  Combivir + Crixivan  Atripla --> well controlled and undetectable for years, switched for TAF regimen  Biktarvy 10/2017 - suppressed  Genotypes:  None on file or from previous provider    HPI:  Clinton Watts has been doing well since our last office visit together. He has continued to take his Biktarvy everyday without any concern for side effect or access to his medication. He is interested in COVID vaccine and would like information about recommendations and how to get the vaccine. Sexually active with one male partner (wife) whom is HIV (-). He has been undetectable for many years.   Worried about blood pressure and diabetes more lately - has an upcoming appointment with PCP soon.     Medical/surgical/social/family history have been updated during today's visit.    Review of Systems  Constitutional: Negative for appetite change, chills, fatigue, fever and unexpected weight change.  Eyes: Negative for visual disturbance.  Respiratory: Negative for cough and  shortness of breath.   Cardiovascular: Negative for chest pain and leg swelling.  Gastrointestinal: Negative for abdominal pain, diarrhea and nausea.  Genitourinary: Negative for discharge, dysuria and genital sores.  Musculoskeletal: Negative for joint swelling.  Skin: Negative for color change and rash.  Neurological: Negative for dizziness and headaches.  Hematological: Negative for adenopathy.  Psychiatric/Behavioral: Negative for sleep disturbance. The patient is not nervous/anxious.      Observations/Objective: Vitals:   09/09/19 1109  BP: (!) 182/79  Pulse: 77  Temp: (!) 97.5 F (36.4 C)  SpO2: 95%   Estimated body mass index is 27.22 kg/m as calculated from the following:   Height as of this encounter: 5\' 8"  (1.727 m).   Weight as of this encounter: 179 lb (81.2 kg).      Physical Exam Vitals and nursing note reviewed.  Constitutional:      Appearance: He is well-developed.     Comments: Seated comfortably in chair during visit.   HENT:     Mouth/Throat:     Dentition: Normal dentition. No dental abscesses.  Cardiovascular:     Rate and Rhythm: Normal rate and regular rhythm.     Heart sounds: Normal heart sounds.  Pulmonary:     Effort: Pulmonary effort is normal.     Breath sounds: Normal breath sounds.  Abdominal:     General: There is no distension.     Palpations: Abdomen is soft.     Tenderness: There is no abdominal tenderness.  Lymphadenopathy:     Cervical: No cervical adenopathy.  Skin:    General: Skin is warm and  dry.     Findings: No rash.  Neurological:     Mental Status: He is alert and oriented to person, place, and time.  Psychiatric:        Judgment: Judgment normal.     Comments: In good spirits today and engaged in care discussion.      LABS:  HIV 1 RNA Quant (copies/mL)  Date Value  08/12/2019 <20 NOT DETECTED  09/19/2018 <20 DETECTED (A)  01/22/2018 <20 NOT DETECTED   CD4 T Cell Abs (/uL)  Date Value  08/12/2019 502   09/19/2018 500  01/22/2018 320 (L)    Lab Results  Component Value Date   CREATININE 1.00 08/12/2019   CREATININE 1.20 09/19/2018   CREATININE 1.07 10/18/2017    Lab Results  Component Value Date   WBC 7.0 08/12/2019   HGB 12.4 (L) 08/12/2019   HCT 39.1 08/12/2019   MCV 67.2 (L) 08/12/2019   PLT 204 08/12/2019    Lab Results  Component Value Date   ALT 18 08/12/2019   AST 23 08/12/2019   ALKPHOS 56 10/18/2017   BILITOT 0.4 08/12/2019     Assessment and Plan: Problem List Items Addressed This Visit      Unprioritized   Type 2 diabetes mellitus without complication, without long-term current use of insulin (HCC)    Uncontrolled - admits that he needs to tweak his medications and has an appointment with PCP soon.   Lab Results  Component Value Date   HGBA1C 11.2 (H) 05/06/2019         Human immunodeficiency virus (HIV) disease (HCC)    Very well controlled on Biktarvy. His labs look great with VL < 20, undetectable and CD4 > 500.  Wife not on PrEP, however with his undetectable viral loads and daily medication she is not at risk for transmission.  Return in about 1 year (around 09/08/2020).       Healthcare maintenance    Counseling provided for COVID19 vaccine. With his age and diabetes history I recommend he consider the Pfizer or Moderna vaccine for higher efficacy.        Other Visit Diagnoses    Essential hypertension    -  Primary   Asymptomatic HIV infection (HCC)       Relevant Orders   CBC   T-helper cell (CD4)- ([facility] clinic only)   Comprehensive metabolic panel   HIV-1 RNA quant-no reflex-bld   Routine screening for STI (sexually transmitted infection)       Relevant Orders   RPR   High risk medication use       Relevant Orders   Lipid panel       Follow Up Instructions: He will follow-up in 6 months.  I have asked him to reach out to his primary care provider team for an appointment as he is overdue.   I discussed the assessment and  treatment plan with the patient. The patient was provided an opportunity to ask questions and all were answered. The patient agreed with the plan and demonstrated an understanding of the instructions.   The patient was advised to call back or seek an in-person evaluation if the symptoms worsen or if the condition fails to improve as anticipated.  I provided 30 minutes of non-face-to-face time during this encounter.   Rexene Alberts, MSN, NP-C [facility]  [email or web address] Pager: [phone] Office: [phone] [facility] Main Line: [phone]

## 2019-09-09 NOTE — Patient Instructions (Addendum)
Nice to see you!  Your labs look great for you HIV - your viral load is undetectable and your CD4 (immune system) is 502. These are great! Please continue your Biktarvy every day.   I am glad you have an appointment with Debbie coming up soon to help manage your diabetes and blood pressure.   Would like to see your blood pressure < 130/80 with your medications and your Hemaglobin A1C 7%.   Would like to see you back in 1 year.   COVID-19 Vaccine Information can be found at: [email or web address] For questions related to vaccine distribution or appointments, please email [email or web address] or call [phone].

## 2019-09-22 NOTE — Assessment & Plan Note (Signed)
Counseling provided for COVID19 vaccine. With his age and diabetes history I recommend he consider the Pfizer or Moderna vaccine for higher efficacy.

## 2019-09-22 NOTE — Assessment & Plan Note (Signed)
Very well controlled on Biktarvy. His labs look great with VL < 20, undetectable and CD4 > 500.  Wife not on PrEP, however with his undetectable viral loads and daily medication she is not at risk for transmission.  Return in about 1 year (around 09/08/2020).

## 2019-09-22 NOTE — Assessment & Plan Note (Signed)
Uncontrolled - admits that he needs to tweak his medications and has an appointment with PCP soon.   Lab Results  Component Value Date   HGBA1C 11.2 (H) 05/06/2019

## 2019-09-23 ENCOUNTER — Other Ambulatory Visit: Payer: Self-pay | Admitting: Infectious Diseases

## 2019-09-23 ENCOUNTER — Telehealth: Payer: Self-pay | Admitting: Family Medicine

## 2019-09-23 DIAGNOSIS — B20 Human immunodeficiency virus [HIV] disease: Secondary | ICD-10-CM

## 2019-09-23 NOTE — Telephone Encounter (Signed)
Appointment 4/7 °Pt aware °

## 2019-09-23 NOTE — Telephone Encounter (Signed)
Please call patient and schedule follow up for diabetes and elevated blood pressure. He is overdue.

## 2019-10-09 ENCOUNTER — Encounter: Payer: Self-pay | Admitting: Family Medicine

## 2019-10-09 ENCOUNTER — Ambulatory Visit: Payer: BC Managed Care – PPO | Admitting: Family Medicine

## 2019-10-09 ENCOUNTER — Other Ambulatory Visit: Payer: Self-pay

## 2019-10-09 VITALS — BP 164/88 | HR 78 | Temp 98.1°F | Wt 179.0 lb

## 2019-10-09 DIAGNOSIS — I10 Essential (primary) hypertension: Secondary | ICD-10-CM | POA: Diagnosis not present

## 2019-10-09 DIAGNOSIS — J302 Other seasonal allergic rhinitis: Secondary | ICD-10-CM | POA: Diagnosis not present

## 2019-10-09 DIAGNOSIS — E119 Type 2 diabetes mellitus without complications: Secondary | ICD-10-CM

## 2019-10-09 LAB — POCT GLYCOSYLATED HEMOGLOBIN (HGB A1C): Hemoglobin A1C: 8.8 % — AB (ref 4.0–5.6)

## 2019-10-09 MED ORDER — METFORMIN HCL ER 500 MG PO TB24: 1000.0000 mg | ORAL_TABLET | Freq: Every day | ORAL | 3 refills | Status: DC

## 2019-10-09 MED ORDER — LOSARTAN POTASSIUM 50 MG PO TABS: 50.0000 mg | ORAL_TABLET | Freq: Every day | ORAL | 3 refills | Status: DC

## 2019-10-09 NOTE — Progress Notes (Signed)
   Subjective:    Patient ID: Clinton Watts, male    DOB: 10/21/1955, 64 y.o.   MRN: [id number]  HPI Chief Complaint  Patient presents with  . Follow-up   Allergies- has been using flonase daily for 3 days. Not sure what else he can take. Runny nose. Works at [employer] Outside a lot.   Diabetes mellitus- checks blood sugars, runs 135- 190s, not above 200s. Eats a lot of salads, sweet potato, fish, steel cut oatmeal, toast. No soda, sweet tea or juice. Drinks water. Rare snacks. Eats 3 meals a day. Has been more careful about carbs.   HTN- was elevated at recent ID visit, patient does not recall being on antihypertensives. Has bp cuff at home.    Review of Systems    per HPI Objective:   Physical Exam Vitals reviewed.  Constitutional:      General: He is not in acute distress.    Appearance: Normal appearance. He is normal weight. He is not ill-appearing, toxic-appearing or diaphoretic.  HENT:     Head: Normocephalic and atraumatic.  Eyes:     Conjunctiva/sclera: Conjunctivae normal.  Cardiovascular:     Rate and Rhythm: Normal rate.  Pulmonary:     Effort: Pulmonary effort is normal.  Neurological:     Mental Status: He is alert and oriented to person, place, and time.  Psychiatric:        Mood and Affect: Mood normal.        Behavior: Behavior normal.        Thought Content: Thought content normal.        Judgment: Judgment normal.       BP (!) 164/88   Pulse 78   Temp 98.1 F (36.7 C) (Temporal)   Wt 179 lb (81.2 kg)   SpO2 98%   BMI 27.22 kg/m  Wt Readings from Last 3 Encounters:  10/09/19 179 lb (81.2 kg)  09/09/19 179 lb (81.2 kg)  07/10/19 176 lb 1.6 oz (79.9 kg)   BP Readings from Last 3 Encounters:  10/09/19 (!) 164/88  09/09/19 (!) 182/79  07/10/19 (!) 167/76        Assessment & Plan:  1. Type 2 diabetes mellitus without complication, without long-term current use of insulin (HCC) - hgba1c improved from 11.2 to 8.8. Provided additional dietary  guidance, will change to extended release metformin - HgB A1c - metFORMIN (GLUCOPHAGE XR) 500 MG 24 hr tablet; Take 2 tablets (1,000 mg total) by mouth daily with breakfast.  Dispense: 180 tablet; Refill: 3 - follow up in 3 months  2. Essential hypertension - has been consistently elevated, will start losartan, he will check weekly blood pressures, was given information regarding proper technique - losartan (COZAAR) 50 MG tablet; Take 1 tablet (50 mg total) by mouth daily.  Dispense: 90 tablet; Refill: 3  3. Allergic rhinitis - continue fluticasone, can add long acting anithistamine  This visit occurred during the SARS-CoV-2 public health emergency.  Safety protocols were in place, including screening questions prior to the visit, additional usage of staff PPE, and extensive cleaning of exam room while observing appropriate contact time as indicated for disinfecting solutions.    [name], FNP-BC  [facility], [facility]  10/09/2019 9:15 AM

## 2019-10-09 NOTE — Progress Notes (Signed)
   Subjective:    Patient ID: Clinton Watts, male    DOB: December 06, 1955, 64 y.o.   MRN: [id number]  HPI  Chief Complaint  Patient presents with  . Follow-up  DM, HTN,     Review of Systems  Running nose, itching eyes     Objective:   Physical Exam  BP (!) 164/88   Pulse 78   Temp 98.1 F (36.7 C) (Temporal)   Wt 179 lb (81.2 kg)   SpO2 98%   BMI 27.22 kg/m          Assessment & Plan:  1. Great improvement on HGB A1C 8.8! Keep up the good work.  2. Recommendation on diet modification with carbohydrates control provided.  3. New prescription for Metformin EX is given. 4. Recommendations on YouTube videos on how to control diabetes and modify the diet provided. 5. Long acting antihistamines are recommended for allergic rhinitis. 6. ARBs for blood pressure control (Losartan 25 mg once a day) 7. Follow-up for HTN control and electrolytes balance check in 3 months.

## 2019-10-09 NOTE — Patient Instructions (Addendum)
Can take long acting antihistamine- Zyrtec, Allegra or Claritin (generic is fine), continue Flonase  Youtube videos- Dr. Wylene Simmer, Dr. Allyson Sabal  Check your blood pressure once a week, keep a log  Follow up in 3 months   How to Take Your Blood Pressure You can take your blood pressure at home with a machine. You may need to check your blood pressure at home:  To check if you have high blood pressure (hypertension).  To check your blood pressure over time.  To make sure your blood pressure medicine is working. Supplies needed: You will need a blood pressure machine, or monitor. You can buy one at a drugstore or online. When choosing one:  Choose one with an arm cuff.  Choose one that wraps around your upper arm. Only one finger should fit between your arm and the cuff.  Do not choose one that measures your blood pressure from your wrist or finger. Your doctor can suggest a monitor. How to prepare Avoid these things for 30 minutes before checking your blood pressure:  Drinking caffeine.  Drinking alcohol.  Eating.  Smoking.  Exercising. Five minutes before checking your blood pressure:  Pee.  Sit in a dining chair. Avoid sitting in a soft couch or armchair.  Be quiet. Do not talk. How to take your blood pressure Follow the instructions that came with your machine. If you have a digital blood pressure monitor, these may be the instructions: 1. Sit up straight. 2. Place your feet on the floor. Do not cross your ankles or legs. 3. Rest your left arm at the level of your heart. You may rest it on a table, desk, or chair. 4. Pull up your shirt sleeve. 5. Wrap the blood pressure cuff around the upper part of your left arm. The cuff should be 1 inch (2.5 cm) above your elbow. It is best to wrap the cuff around bare skin. 6. Fit the cuff snugly around your arm. You should be able to place only one finger between the cuff and your arm. 7. Put the cord inside the groove of your  elbow. 8. Press the power button. 9. Sit quietly while the cuff fills with air and loses air. 10. Write down the numbers on the screen. 11. Wait 2-3 minutes and then repeat steps 1-10. What do the numbers mean? Two numbers make up your blood pressure. The first number is called systolic pressure. The second is called diastolic pressure. An example of a blood pressure reading is "120 over 80" (or 120/80). If you are an adult and do not have a medical condition, use this guide to find out if your blood pressure is normal: Normal  First number: below 120.  Second number: below 80. Elevated  First number: 120-129.  Second number: below 80. Hypertension stage 1  First number: 130-139.  Second number: 80-89. Hypertension stage 2  First number: 140 or above.  Second number: 90 or above. Your blood pressure is above normal even if only the top or bottom number is above normal. Follow these instructions at home:  Check your blood pressure as often as your doctor tells you to.  Take your monitor to your next doctor's appointment. Your doctor will: ? Make sure you are using it correctly. ? Make sure it is working right.  Make sure you understand what your blood pressure numbers should be.  Tell your doctor if your medicines are causing side effects. Contact a doctor if:  Your blood pressure keeps  being high. Get help right away if:  Your first blood pressure number is higher than 180.  Your second blood pressure number is higher than 120. This information is not intended to replace advice given to you by your health care provider. Make sure you discuss any questions you have with your health care provider. Document Revised: 06/02/2017 Document Reviewed: 11/27/2015 Elsevier Patient Education  2020 [employer].

## 2019-11-04 ENCOUNTER — Other Ambulatory Visit: Payer: Self-pay | Admitting: Family Medicine

## 2019-11-06 NOTE — Telephone Encounter (Signed)
LOV 10/09/19, next appointment is on 01/20/20.  Left message for patient to call back. In the patient's med list we have Sildenafil 20 mg and Tadalafil 20 mg. Which medication does patient take? Refill request from pharmacy is coming over for Sildenafil.

## 2019-11-10 IMAGING — DX DG CHEST 2V
2 series · 2 of 2 positions shown · non-contrast
Comparison: CT Abdomen and Pelvis 10/18/2017

CLINICAL DATA: 62-year-old male with cough for 2 weeks. HIV and
diabetes.

EXAM:
CHEST - 2 VIEW

[chest pa]
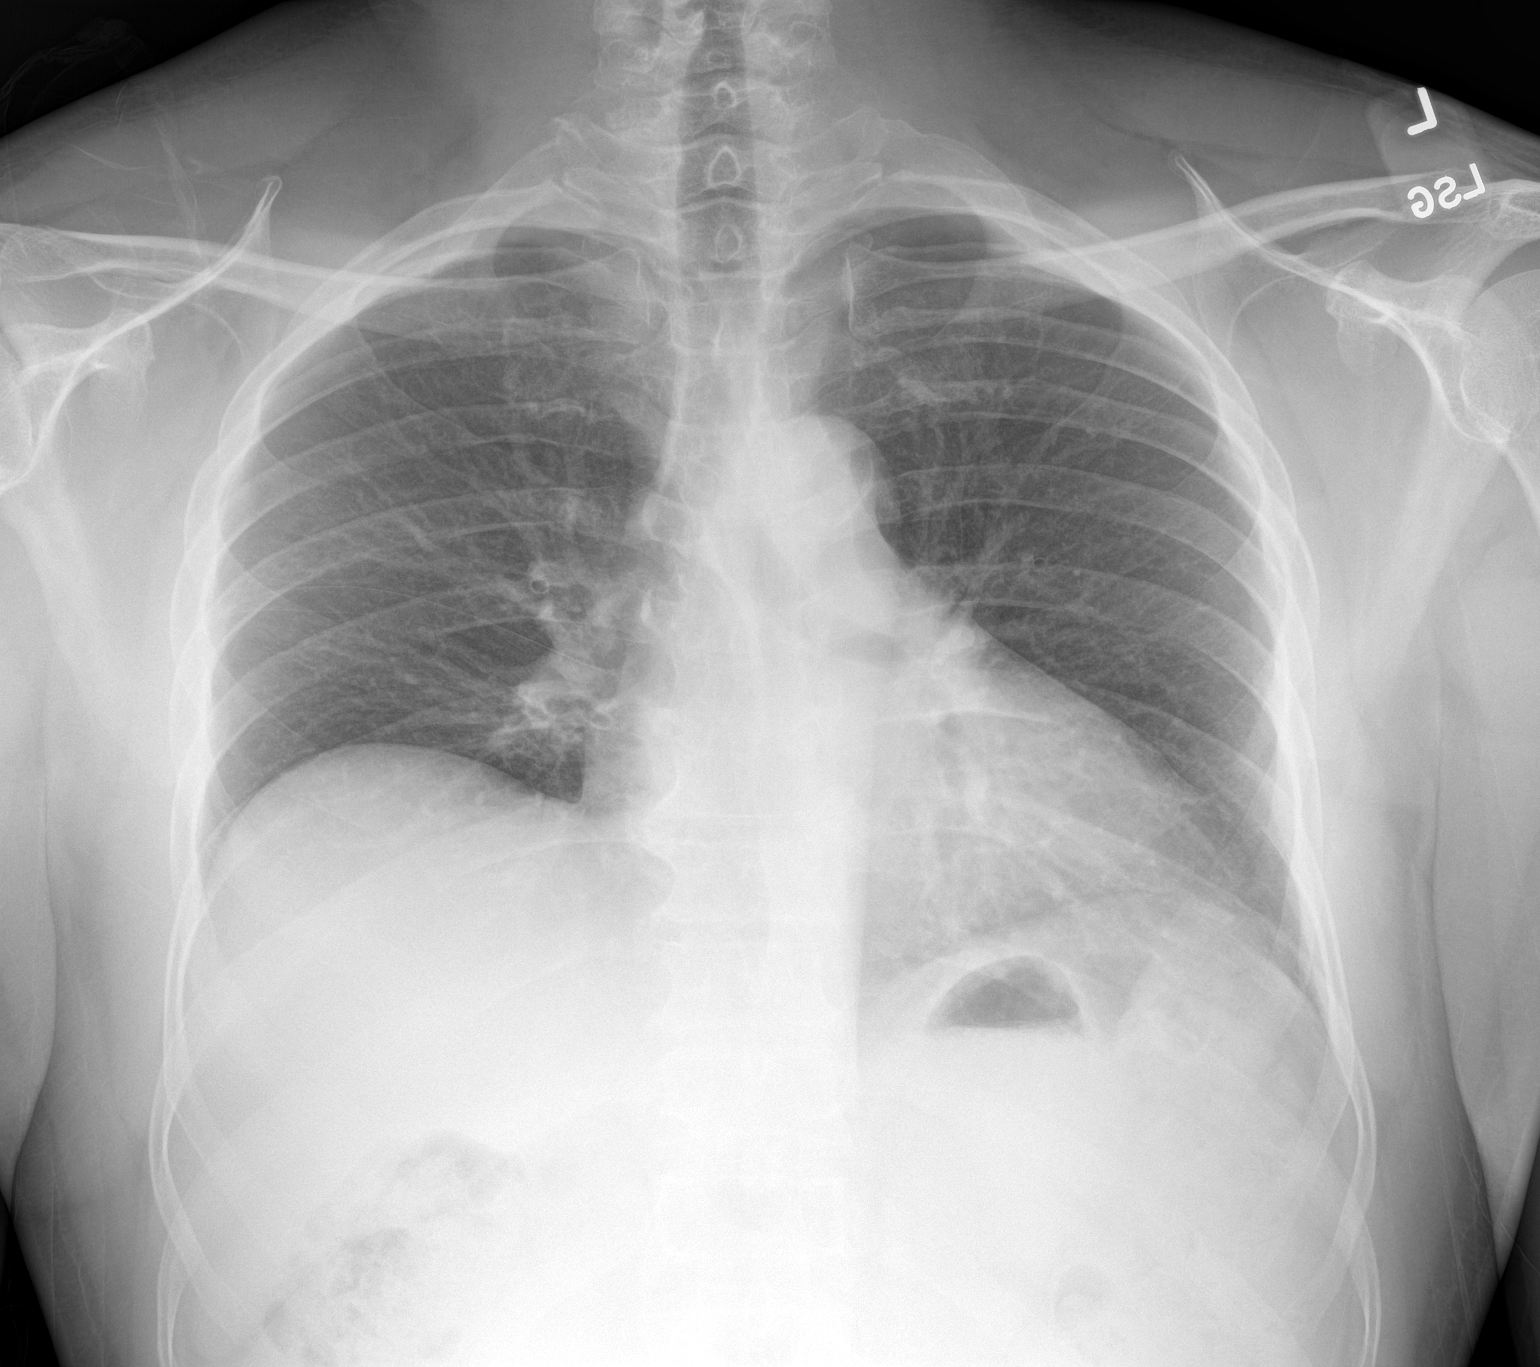

[chest lat]
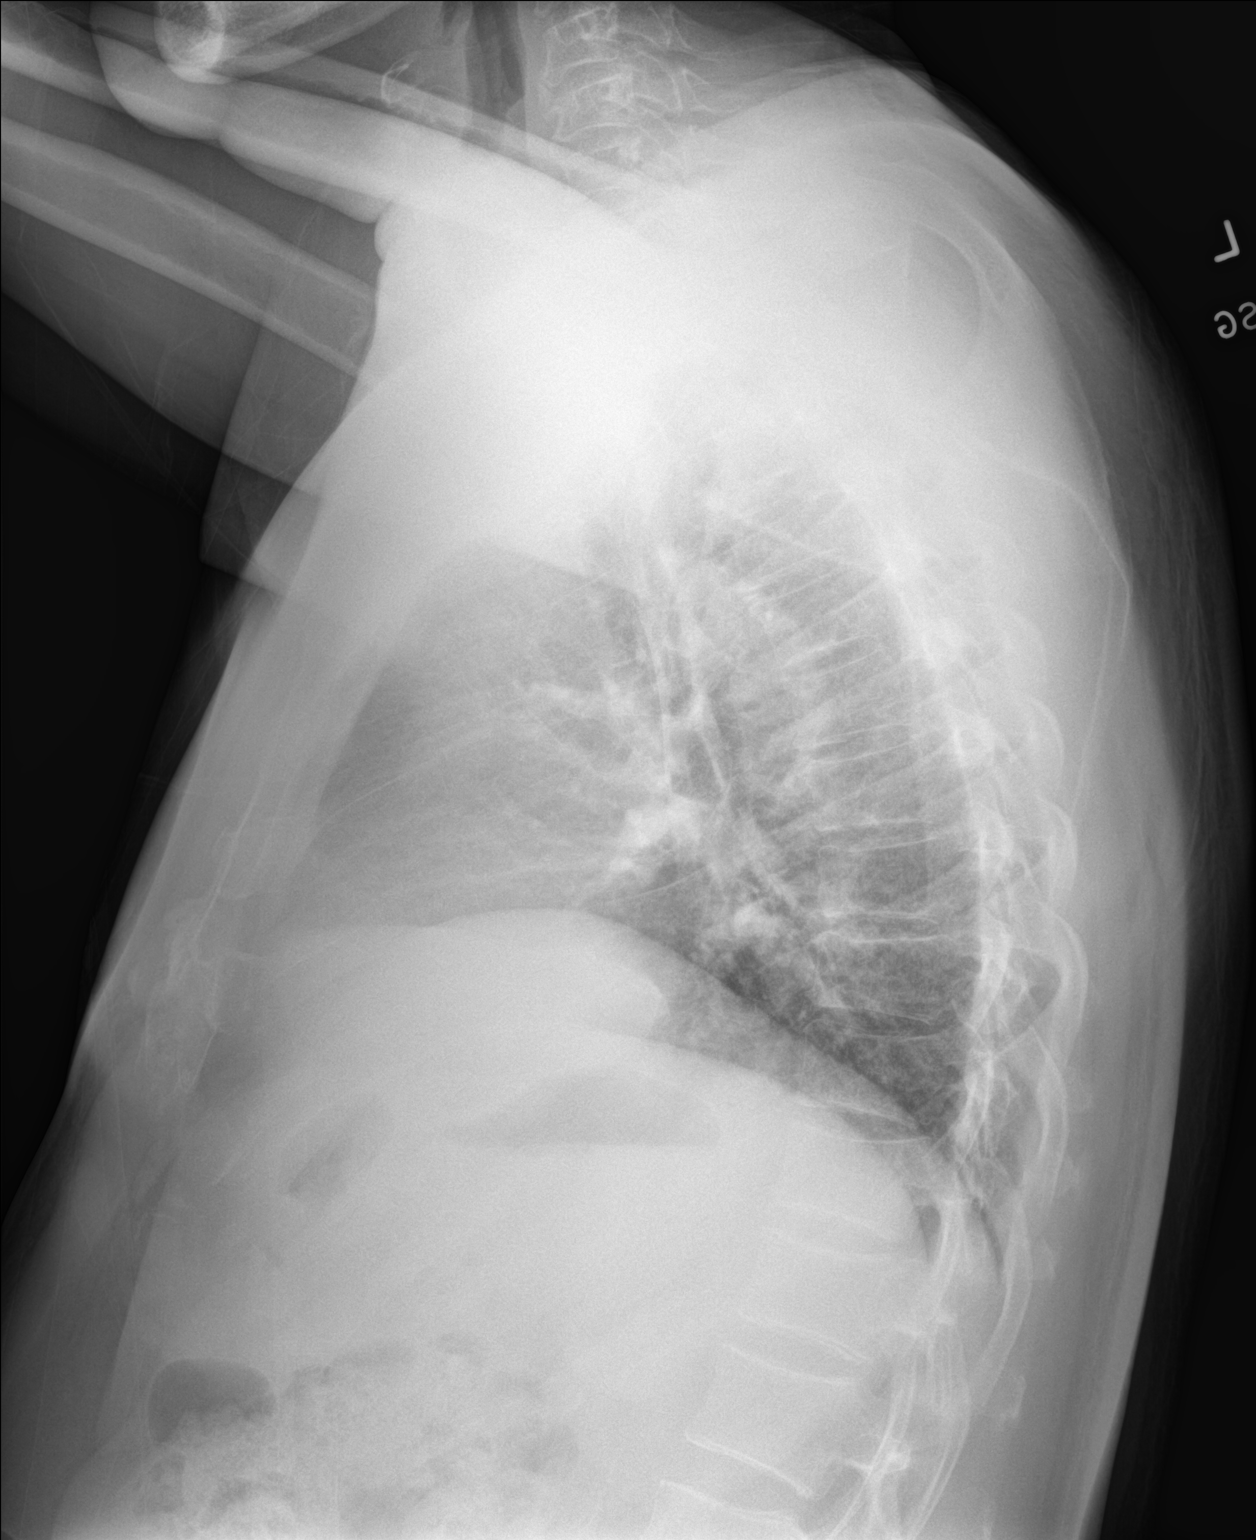

[2 of 2 positions shown; findings below may reference images not displayed]

FINDINGS: Low lung volumes, the both lungs appear clear. No pneumothorax or
pleural effusion. Mild elevation of the right hemidiaphragm appears
to be a normal variant. Normal cardiac size and mediastinal
contours. Visualized tracheal air column is within normal limits. No
acute osseous abnormality identified. Stable visible lower thoracic
levels. Negative visible bowel gas pattern.
IMPRESSION: Low lung volumes.  No acute cardiopulmonary abnormality.

## 2019-11-14 ENCOUNTER — Other Ambulatory Visit: Payer: Self-pay | Admitting: Urology

## 2020-01-13 ENCOUNTER — Encounter: Payer: Self-pay | Admitting: Family Medicine

## 2020-01-13 DIAGNOSIS — E119 Type 2 diabetes mellitus without complications: Secondary | ICD-10-CM | POA: Diagnosis not present

## 2020-01-20 ENCOUNTER — Ambulatory Visit: Payer: BC Managed Care – PPO | Admitting: Family Medicine

## 2020-02-10 ENCOUNTER — Telehealth: Payer: Self-pay | Admitting: Family Medicine

## 2020-02-10 NOTE — Telephone Encounter (Signed)
Please call patient for an exam and to resubmit the referral. Tell him he needs to have an appointment. He was seen for this issue and the original referral was made February 2019. He is also due for repeat blood work for his diabetes.

## 2020-02-10 NOTE — Telephone Encounter (Signed)
Patient called in stating he would like to receive referral for hernia removal surgery. States he never got the surgery done but that referral was sent previously. Wanting to resend to previous location. Please advise if possible.

## 2020-02-11 NOTE — Telephone Encounter (Signed)
Robin,  Please schedule office with with Debbie.

## 2020-02-12 NOTE — Telephone Encounter (Signed)
Pt declined to schedule he will call back

## 2020-02-12 NOTE — Telephone Encounter (Signed)
Noted  

## 2020-02-19 ENCOUNTER — Other Ambulatory Visit: Payer: Self-pay

## 2020-02-19 ENCOUNTER — Ambulatory Visit (HOSPITAL_COMMUNITY)
Admission: EM | Admit: 2020-02-19 | Discharge: 2020-02-19 | Disposition: A | Discharge status: Home / Self Care | Payer: BC Managed Care – PPO | Attending: Family Medicine | Admitting: Family Medicine

## 2020-02-19 ENCOUNTER — Ambulatory Visit: Payer: BC Managed Care – PPO | Admitting: Family Medicine

## 2020-02-19 ENCOUNTER — Encounter: Payer: Self-pay | Admitting: Family Medicine

## 2020-02-19 ENCOUNTER — Encounter (HOSPITAL_COMMUNITY): Payer: Self-pay

## 2020-02-19 DIAGNOSIS — U071 COVID-19: Secondary | ICD-10-CM | POA: Insufficient documentation

## 2020-02-19 DIAGNOSIS — R110 Nausea: Secondary | ICD-10-CM | POA: Diagnosis not present

## 2020-02-19 DIAGNOSIS — R6883 Chills (without fever): Secondary | ICD-10-CM | POA: Insufficient documentation

## 2020-02-19 LAB — SARS CORONAVIRUS 2 (TAT 6-24 HRS): SARS Coronavirus 2: POSITIVE — AB

## 2020-02-19 MED ORDER — ONDANSETRON 4 MG PO TBDP: 4.0000 mg | ORAL_TABLET | Freq: Three times a day (TID) | ORAL | 0 refills | Status: DC | PRN

## 2020-02-19 NOTE — Discharge Instructions (Signed)
Zofran as needed for nausea and vomiting.  Covid swab pending.  Take precautions until we receive the results.  Follow up as needed for continued or worsening symptoms

## 2020-02-19 NOTE — Progress Notes (Signed)
Patient's wife had virtual visit. He has similar symptoms, recent travel and exposure to Covid, needs test. Was instructed to notify office if positive test.

## 2020-02-19 NOTE — ED Triage Notes (Signed)
Pt c/o nauseax3 days. Pt denies any other symptoms. Pt wants COVID test.

## 2020-02-20 ENCOUNTER — Telehealth: Payer: Self-pay

## 2020-02-20 NOTE — Telephone Encounter (Signed)
Clinton Watts is out today and will see this when she returns.   Clinic policy for work notes is that patient needs to be seen (virtual) by a provider here.   He could also contact the Urgent care to get a work note for isolation  General guidelines are 10 days

## 2020-02-20 NOTE — Telephone Encounter (Signed)
Went to UC yesterday and was tested and found out this morning he is Covid positive. He is needing a work note for however long he is to stay quarantined. Please put note on MyChart. Call pt at [phone].

## 2020-02-20 NOTE — ED Provider Notes (Signed)
[facility]    CSN: [id number] Arrival date & time: 02/19/20  1037      History   Chief Complaint Chief Complaint  Patient presents with  . Abdominal Pain    HPI Clinton Watts is a 64 y.o. male.   Pt is a 64 year old male that presents with nausea x 3 days. Symptoms have been constant. Wife also sick. No abdominal pain, fever, cough, nasal congestion, rhinorrhea.      Past Medical History:  Diagnosis Date  . Diabetes mellitus without complication (HCC)   . ED (erectile dysfunction)   . HIV (human immunodeficiency virus infection) (HCC) 10/12/2017  . Hyperlipidemia     Patient Active Problem List   Diagnosis Date Noted  . Erectile dysfunction due to arterial insufficiency 07/30/2019  . Hypogonadism in male 07/13/2019  . Elevated blood pressure reading 01/22/2018  . Human immunodeficiency virus (HIV) disease (HCC) 10/12/2017  . Healthcare maintenance 10/12/2017  . Erectile dysfunction 10/12/2017  . History of hernia repair 08/26/2017  . Type 2 diabetes mellitus without complication, without long-term current use of insulin (HCC) 08/26/2017  . Hyperlipidemia associated with type 2 diabetes mellitus (HCC) 08/26/2017    Past Surgical History:  Procedure Laterality Date  . HERNIA REPAIR         Home Medications    Prior to Admission medications   Medication Sig Start Date End Date Taking? Authorizing Provider  albuterol (PROVENTIL HFA;VENTOLIN HFA) 108 (90 Base) MCG/ACT inhaler  09/08/18   [provider]  BIKTARVY 50-200-25 MG TABS tablet TAKE ONE TABLET BY MOUTH ONCE DAILY. STORE AT ROOM TEMPERATURE. MAY TAKE WITH OR WITHOUT FOOD. 09/23/19   Blanchard Kelch, NP  glipiZIDE (GLUCOTROL XL) 5 MG 24 hr tablet Take 1 tablet (5 mg total) by mouth daily with breakfast. 05/13/19   Emi Belfast, FNP  losartan (COZAAR) 50 MG tablet Take 1 tablet (50 mg total) by mouth daily. 10/09/19   Emi Belfast, FNP  metFORMIN (GLUCOPHAGE XR) 500 MG 24 hr  tablet Take 2 tablets (1,000 mg total) by mouth daily with breakfast. 10/09/19   Emi Belfast, FNP  ondansetron (ZOFRAN ODT) 4 MG disintegrating tablet Take 1 tablet (4 mg total) by mouth every 8 (eight) hours as needed for nausea or vomiting. 02/19/20   Dahlia Byes A, NP  rosuvastatin (CRESTOR) 20 MG tablet Take 1 tablet (20 mg total) by mouth daily. 05/06/19   Emi Belfast, FNP  sildenafil (REVATIO) 20 MG tablet TAKE 2 TO 5 TABLETS ONE HOUR PRIOR TO INTERCOURSE 11/14/19   Stoioff, Verna Czech, MD  tadalafil (CIALIS) 20 MG tablet 1 tablet 1 hour prior to intercourse 07/30/19   [name], MD    Family History Family History  Problem Relation Age of Onset  . Diabetes Mother   . Stomach cancer Sister   . Pancreatic cancer Brother   . Prostate cancer Neg Hx   . Bladder Cancer Neg Hx   . Kidney cancer Neg Hx     Social History Social History   Tobacco Use  . Smoking status: Never Smoker  . Smokeless tobacco: Never Used  Vaping Use  . Vaping Use: Never used  Substance Use Topics  . Alcohol use: No  . Drug use: No     Allergies   Patient has no known allergies.   Review of Systems Review of Systems   Physical Exam Triage Vital Signs ED Triage Vitals  Enc Vitals Group  BP 02/19/20 1253 (!) 166/94     Pulse Rate 02/19/20 1253 83     Resp 02/19/20 1253 16     Temp 02/19/20 1253 98.7 F (37.1 C)     Temp Source 02/19/20 1253 Oral     SpO2 02/19/20 1253 100 %     Weight 02/19/20 1254 170 lb (77.1 kg)     Height 02/19/20 1254 5\' 8"  (1.727 m)     Head Circumference --      Peak Flow --      Pain Score 02/19/20 1254 0     Pain Loc --      Pain Edu? --      Excl. in GC? --    No data found.  Updated Vital Signs BP (!) 166/94   Pulse 83   Temp 98.7 F (37.1 C) (Oral)   Resp 16   Ht 5\' 8"  (1.727 m)   Wt 170 lb (77.1 kg)   SpO2 100%   BMI 25.85 kg/m   Visual Acuity Right Eye Distance:   Left Eye Distance:   Bilateral Distance:    Right Eye  Near:   Left Eye Near:    Bilateral Near:     Physical Exam Vitals and nursing note reviewed.  Constitutional:      Appearance: Normal appearance.  HENT:     Head: Normocephalic and atraumatic.     Nose: Nose normal.  Eyes:     Conjunctiva/sclera: Conjunctivae normal.  Pulmonary:     Effort: Pulmonary effort is normal.  Abdominal:     Palpations: Abdomen is soft.  Musculoskeletal:        General: Normal range of motion.     Cervical back: Normal range of motion.  Skin:    General: Skin is warm and dry.  Neurological:     Mental Status: He is alert.  Psychiatric:        Mood and Affect: Mood normal.      UC Treatments / Results  Labs (all labs ordered are listed, but only abnormal results are displayed) Labs Reviewed  SARS CORONAVIRUS 2 (TAT 6-24 HRS) - Abnormal; Notable for the following components:      Result Value   SARS Coronavirus 2 POSITIVE (*)    All other components within normal limits    EKG   Radiology No results found.  Procedures Procedures (including critical care time)  Medications Ordered in UC Medications - No data to display  Initial Impression / Assessment and Plan / UC Course  I have reviewed the triage vital signs and the nursing notes.  Pertinent labs & imaging results that were available during my care of the patient were reviewed by me and considered in my medical decision making (see chart for details).     Nausea and chills Most likely viral  COVID swab pending.  Follow up as needed for continued or worsening symptoms  Final Clinical Impressions(s) / UC Diagnoses   Final diagnoses:  Nausea without vomiting  Chills (without fever)     Discharge Instructions     Zofran as needed for nausea and vomiting.  Covid swab pending.  Take precautions until we receive the results.  Follow up as needed for continued or worsening symptoms     ED Prescriptions    Medication Sig Dispense Auth. Provider   ondansetron (ZOFRAN  ODT) 4 MG disintegrating tablet Take 1 tablet (4 mg total) by mouth every 8 (eight) hours as needed for nausea or vomiting.  20 tablet Dahlia Byes A, NP     PDMP not reviewed this encounter.   Janace Aris, NP 02/20/20 1503

## 2020-02-20 NOTE — Telephone Encounter (Signed)
Mr. Methot went to Urgent Care and got work note.  Patient called and ask for copy of his positive covid test results for his employer.  Patient was having a hard time viewing it on his MyChart.  Copy given to girls up front for patient to send someone by office to pick up.

## 2020-02-21 ENCOUNTER — Other Ambulatory Visit: Payer: Self-pay | Admitting: Family Medicine

## 2020-02-21 ENCOUNTER — Telehealth: Payer: Self-pay | Admitting: Family Medicine

## 2020-02-21 NOTE — Telephone Encounter (Signed)
Called and spoke with patient. No vomiting, stomach feels "weak." Eating bland diet. Having a lot of post nasal drainage. Advised him to take zyrtec daily (has at home) and continue bland foods. Stop ondansetron if not helping. Reviewed quarantine instructions with him. Follow up if worsening symptoms.

## 2020-02-21 NOTE — Telephone Encounter (Signed)
He can take an over-the-counter long-acting antihistamine such as loratadine or cetirizine for drainage.  I have sent in an antinausea medication that he can take as needed.  It may cause constipation.  If this happens can take a stool softener or use MiraLAX.

## 2020-02-21 NOTE — Telephone Encounter (Signed)
Spoke with Clinton Watts.  He states he is taking the Zofran but it is not helping.

## 2020-02-21 NOTE — Telephone Encounter (Signed)
Noted  

## 2020-02-21 NOTE — Telephone Encounter (Signed)
It looks like antinausea medication was sent to his pharmacy 02/19/20, ondansetron. Did he pick this up?

## 2020-02-21 NOTE — Telephone Encounter (Signed)
Pt said he is having weakness in his stomach. He is experiencing nausea from his drainage. He wants to know if anything can be called in for this?

## 2020-03-02 ENCOUNTER — Ambulatory Visit: Payer: BC Managed Care – PPO | Admitting: Family Medicine

## 2020-03-04 ENCOUNTER — Telehealth (INDEPENDENT_AMBULATORY_CARE_PROVIDER_SITE_OTHER): Payer: BC Managed Care – PPO | Admitting: Family Medicine

## 2020-03-04 ENCOUNTER — Encounter: Payer: Self-pay | Admitting: Family Medicine

## 2020-03-04 ENCOUNTER — Other Ambulatory Visit: Payer: Self-pay

## 2020-03-04 VITALS — BP 138/76 | HR 88 | Ht 68.0 in | Wt 168.0 lb

## 2020-03-04 DIAGNOSIS — E119 Type 2 diabetes mellitus without complications: Secondary | ICD-10-CM

## 2020-03-04 DIAGNOSIS — U071 COVID-19: Secondary | ICD-10-CM

## 2020-03-04 NOTE — Progress Notes (Signed)
Virtual Visit via Video Note  I connected with Clinton Watts on 03/04/20 at  9:00 AM EDT by a video enabled telemedicine application and verified that I am speaking with the correct person using two identifiers.  Location: Patient: His home Provider: [facility] Patient's participating in virtual visit: Patient, provider I discussed the limitations of evaluation and management by telemedicine and the availability of in person appointments. The patient expressed understanding and agreed to proceed.  History of Present Illness: Chief Complaint  Patient presents with  . positive covid test    Tested positive 02/19/2020  . FMLA PPW   This is a 64 year old male who presents today for virtual visit for follow-up of recent COVID-19 infection. He tested + 02/19/2020. He reports that he still is not feeling back to himself. He has headaches which come and go, decreased sense of taste and smell, fatigue, dyspnea on exertion. He reports that his symptoms are slowly improving and he no longer has any nausea or vomiting. 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 He is requesting FMLA paperwork to be completed.  Reports that he is rarely checking his blood sugars. He reports he had a few high readings a couple of weeks ago but he is also having some within normal range. Overdue for hemoglobin A1c.  Observations/Objective: Patient is alert and answers questions appropriately. Visible skin is unremarkable. He is normally conversive without increased work of breathing, audible wheeze or witnessed cough. Mood and affect are appropriate.  Assessment and Plan: 1. COVID-19 -Discussed course of illness. Patient with improvement of symptoms but it is slow. -He will have his employer sent FMLA paperwork via fax -Reviewed importance of rest, adequate hydration, follow-up precautions reviewed  2. Type 2 diabetes mellitus without complication,  without long-term current use of insulin (HCC) -He was instructed to schedule follow-up for his diabetes mellitus   Clinton Ree, FNP-BC  [facility], [facility]  03/04/2020 5:32 PM   Follow Up Instructions:    I discussed the assessment and treatment plan with the patient. The patient was provided an opportunity to ask questions and all were answered. The patient agreed with the plan and demonstrated an understanding of the instructions.   The patient was advised to call back or seek an in-person evaluation if the symptoms worsen or if the condition fails to improve as anticipated.   Clinton Belfast, FNP

## 2020-06-01 ENCOUNTER — Telehealth (INDEPENDENT_AMBULATORY_CARE_PROVIDER_SITE_OTHER): Payer: BC Managed Care – PPO | Admitting: Family Medicine

## 2020-06-01 ENCOUNTER — Encounter: Payer: Self-pay | Admitting: Family Medicine

## 2020-06-01 ENCOUNTER — Other Ambulatory Visit: Payer: Self-pay

## 2020-06-01 VITALS — Ht 68.0 in | Wt 179.0 lb

## 2020-06-01 DIAGNOSIS — R21 Rash and other nonspecific skin eruption: Secondary | ICD-10-CM

## 2020-06-01 DIAGNOSIS — J0100 Acute maxillary sinusitis, unspecified: Secondary | ICD-10-CM

## 2020-06-01 DIAGNOSIS — H5789 Other specified disorders of eye and adnexa: Secondary | ICD-10-CM

## 2020-06-01 DIAGNOSIS — B0239 Other herpes zoster eye disease: Secondary | ICD-10-CM | POA: Diagnosis not present

## 2020-06-01 LAB — HM DIABETES EYE EXAM

## 2020-06-01 MED ORDER — AMOXICILLIN-POT CLAVULANATE 875-125 MG PO TABS: 1.0000 | ORAL_TABLET | Freq: Two times a day (BID) | ORAL | 0 refills | Status: AC

## 2020-06-01 NOTE — Progress Notes (Signed)
Virtual Visit via Video Note  I connected with Clinton Watts on 06/01/20 at  2:00 PM EST by a video enabled telemedicine application and verified that I am speaking with the correct person using two identifiers.  Location: Patient: At his place of employment Provider: [facility] Persons participating in virtual visit: Patient, provider   I discussed the limitations of evaluation and management by telemedicine and the availability of in person appointments. The patient expressed understanding and agreed to proceed.  History of Present Illness: Chief Complaint  Patient presents with  . Acute Home Visit    ear drainaige, pain and presure in face  This is a 64 year old male who presents today for virtual visit with above chief complaint. He  has a past medical history of Diabetes mellitus without complication (HCC), ED (erectile dysfunction), HIV (human immunodeficiency virus infection) (HCC) (10/12/2017), and Hyperlipidemia. He reports 4 days of left eye swelling, left-sided headache, rash on left side of head.  He has had some pustular drainage from the left eye and some watery nasal drainage.  He has had off-and-on sore throat.  He denies fever, muscle aches, fatigue.  He does not recall having chickenpox as a child.  He does not think he is vaccinated against shingles.    Observations/Objective: Patient is alert and answers questions appropriately.  He has visible left eyelid swelling and a rash on his left temporal region.  Unfortunately, due to video quality, I am unable to get a good look at the rash to see whether or not it is vesicular.  He is normally conversive without increased work of breathing with conversation and moving around.  No audible wheeze or cough.  Mood and affect are appropriate.  Ht 5\' 8"  (1.727 m)   Wt 179 lb (81.2 kg)   BMI 27.22 kg/m  Wt Readings from Last 3 Encounters:  06/01/20 179 lb (81.2 kg)  03/04/20 168 lb (76.2 kg)  02/19/20 170 lb (77.1 kg)   BP  Readings from Last 3 Encounters:  03/04/20 138/76  02/19/20 (!) 166/94  10/09/19 (!) 164/88    Assessment and Plan: 1. Rash of face -Unclear as to whether or not this is a shingles outbreak.  He was past the antiviral treatment window.  I am going to go ahead and cover him with an antibiotic.  I have advised him to see his ophthalmologist and he has an appointment for later today. -Follow-up precautions reviewed  2. Swelling of left eye -Per #1  3. Acute non-recurrent maxillary sinusitis -His medication list contained levofloxacin and the patient reports that this was prescribed to him by his "church doctor," he has been advised to stop this and to start Augmentin - amoxicillin-clavulanate (AUGMENTIN) 875-125 MG tablet; Take 1 tablet by mouth 2 (two) times daily for 7 days. STOP LEVOFLOXACIN.  Dispense: 14 tablet; Refill: 0   12/09/19, FNP-BC  [facility] at [facility], [facility]  06/01/2020 5:17 PM   Follow Up Instructions:    I discussed the assessment and treatment plan with the patient. The patient was provided an opportunity to ask questions and all were answered. The patient agreed with the plan and demonstrated an understanding of the instructions.   The patient was advised to call back or seek an in-person evaluation if the symptoms worsen or if the condition fails to improve as anticipated.   06/03/2020, FNP

## 2020-06-04 DIAGNOSIS — B0239 Other herpes zoster eye disease: Secondary | ICD-10-CM | POA: Diagnosis not present

## 2020-06-08 DIAGNOSIS — B0239 Other herpes zoster eye disease: Secondary | ICD-10-CM | POA: Diagnosis not present

## 2020-06-08 DIAGNOSIS — H52223 Regular astigmatism, bilateral: Secondary | ICD-10-CM | POA: Diagnosis not present

## 2020-07-14 ENCOUNTER — Encounter: Payer: Self-pay | Admitting: Family Medicine

## 2020-07-14 ENCOUNTER — Ambulatory Visit (INDEPENDENT_AMBULATORY_CARE_PROVIDER_SITE_OTHER): Payer: BC Managed Care – PPO | Admitting: Family Medicine

## 2020-07-14 ENCOUNTER — Other Ambulatory Visit: Payer: Self-pay

## 2020-07-14 VITALS — BP 151/78 | HR 91 | Temp 96.9°F | Ht 65.5 in | Wt 165.4 lb

## 2020-07-14 DIAGNOSIS — E785 Hyperlipidemia, unspecified: Secondary | ICD-10-CM

## 2020-07-14 DIAGNOSIS — E1169 Type 2 diabetes mellitus with other specified complication: Secondary | ICD-10-CM

## 2020-07-14 DIAGNOSIS — N5201 Erectile dysfunction due to arterial insufficiency: Secondary | ICD-10-CM | POA: Diagnosis not present

## 2020-07-14 DIAGNOSIS — I10 Essential (primary) hypertension: Secondary | ICD-10-CM | POA: Diagnosis not present

## 2020-07-14 DIAGNOSIS — E119 Type 2 diabetes mellitus without complications: Secondary | ICD-10-CM

## 2020-07-14 LAB — COMPREHENSIVE METABOLIC PANEL
ALT: 15 U/L (ref 0–53)
AST: 21 U/L (ref 0–37)
Albumin: 4.7 g/dL (ref 3.5–5.2)
Alkaline Phosphatase: 101 U/L (ref 39–117)
BUN: 19 mg/dL (ref 6–23)
CO2: 31 mEq/L (ref 19–32)
Calcium: 10.1 mg/dL (ref 8.4–10.5)
Chloride: 100 mEq/L (ref 96–112)
Creatinine, Ser: 1.09 mg/dL (ref 0.40–1.50)
GFR: 70.6 mL/min (ref >60.00)
Glucose, Bld: 247 mg/dL — ABNORMAL HIGH (ref 70–99)
Potassium: 5.1 mEq/L (ref 3.5–5.1)
Sodium: 135 mEq/L (ref 135–145)
Total Bilirubin: 0.4 mg/dL (ref 0.2–1.2)
Total Protein: 7.6 g/dL (ref 6.0–8.3)

## 2020-07-14 LAB — LIPID PANEL
Cholesterol: 136 mg/dL (ref 0–200)
HDL: 30.7 mg/dL — ABNORMAL LOW (ref >39.00)
NonHDL: 104.97
Total CHOL/HDL Ratio: 4
Triglycerides: 300 mg/dL — ABNORMAL HIGH (ref 0.0–149.0)
VLDL: 60 mg/dL — ABNORMAL HIGH (ref 0.0–40.0)

## 2020-07-14 LAB — HEMOGLOBIN A1C: Hgb A1c MFr Bld: 11 % — ABNORMAL HIGH (ref 4.6–6.5)

## 2020-07-14 LAB — CBC WITH DIFFERENTIAL/PLATELET
Basophils Absolute: 0.1 10*3/uL (ref 0.0–0.1)
Basophils Relative: 0.9 % (ref 0.0–3.0)
Eosinophils Absolute: 0.1 10*3/uL (ref 0.0–0.7)
Eosinophils Relative: 1.6 % (ref 0.0–5.0)
HCT: 41.4 % (ref 39.0–52.0)
Hemoglobin: 13.4 g/dL (ref 13.0–17.0)
Lymphocytes Relative: 25.9 % (ref 12.0–46.0)
Lymphs Abs: 1.7 10*3/uL (ref 0.7–4.0)
MCHC: 32.4 g/dL (ref 30.0–36.0)
MCV: 65.8 fl — ABNORMAL LOW (ref 78.0–100.0)
Monocytes Absolute: 0.4 10*3/uL (ref 0.1–1.0)
Monocytes Relative: 6.4 % (ref 3.0–12.0)
Neutro Abs: 4.3 10*3/uL (ref 1.4–7.7)
Neutrophils Relative %: 65.2 % (ref 43.0–77.0)
Platelets: 226 10*3/uL (ref 150.0–400.0)
RBC: 6.29 Mil/uL — ABNORMAL HIGH (ref 4.22–5.81)
RDW: 16.3 % — ABNORMAL HIGH (ref 11.5–15.5)
WBC: 6.6 10*3/uL (ref 4.0–10.5)

## 2020-07-14 LAB — LDL CHOLESTEROL, DIRECT: Direct LDL: 67 mg/dL

## 2020-07-14 LAB — TSH: TSH: 1.12 u[IU]/mL (ref 0.35–4.50)

## 2020-07-14 NOTE — Assessment & Plan Note (Signed)
Sees urology/ Dr Lonna Cobb Takes cialis

## 2020-07-14 NOTE — Progress Notes (Signed)
Subjective:    Patient ID: Clinton Watts, male    DOB: 23-Jun-1956, 65 y.o.   MRN: [id number]  This visit occurred during the SARS-CoV-2 public health emergency.  Safety protocols were in place, including screening questions prior to the visit, additional usage of staff PPE, and extensive cleaning of exam room while observing appropriate contact time as indicated for disinfecting solutions.    HPI 65 yo pt presents for transfer of care from [facility] Readings from Last 3 Encounters:  07/14/20 165 lb 7 oz (75 kg)  06/01/20 179 lb (81.2 kg)  03/04/20 168 lb (76.2 kg)   Pulse Readings from Last 3 Encounters:  07/14/20 91  03/04/20 88  02/19/20 83  made diet changes - lost weight  He exercises -walking to just a little jogging    H/o HIV  Being treated  Doing very well  CD4 count is good  Will have f/u in march   Urology - sees Dr Lonna Cobb Takes cialis -works well for him    H/o elevated bp  BP Readings from Last 3 Encounters:  07/14/20 (!) 151/78  03/04/20 138/76  02/19/20 (!) 166/94    Taking losartan 50 mg daily    Pulse Readings from Last 3 Encounters:  07/14/20 91  03/04/20 88  02/19/20 83    imms  covid pfizer in April with booster in oct   pnv 13 and pnv 23 utd from 2016    DM2 Has been diabetic since about 2012  Lab Results  Component Value Date   HGBA1C 8.8 (A) 10/09/2019   Metformin xr 1000 mg daily -am Glipizide xl 5 mg daily -am  Does not miss doses  Has not been f/u due to the pandemic   Blood glucose was around 225 this am  During the day will drop to 160   Does not drop low  Does not get hypoglycemic symptoms   Eating well  Lots of vegetables/ fish  No fried foods   Avoids sweets /doing better  Walking   On statin and arb  Eye exam 7/21   Hyperlipidemia  Lab Results  Component Value Date   CHOL 117 08/12/2019   HDL 27 (L) 08/12/2019   LDLCALC 51 08/12/2019   LDLDIRECT 119.0 05/06/2019   TRIG 368 (H) 08/12/2019    CHOLHDL 4.3 08/12/2019   crestor 20 mg daily   Does not sleep well  Goes to bed at 11 pm and wakes up at 4 am  Does not tired at all   He works full time (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 Erectile dysfunction due to arterial insufficiency 07/30/2019  . Hypogonadism in male 07/13/2019  . Essential hypertension 01/22/2018  . Human immunodeficiency virus (HIV) disease (HCC) 10/12/2017  . Healthcare maintenance 10/12/2017  . Erectile dysfunction 10/12/2017  . History of hernia repair 08/26/2017  . Type 2 diabetes mellitus without complication, without long-term current use of insulin (HCC) 08/26/2017  . Hyperlipidemia associated with type 2 diabetes mellitus (HCC) 08/26/2017   Past Medical History:  Diagnosis Date  . Diabetes mellitus without complication (HCC)   . ED (erectile dysfunction)   . HIV (human immunodeficiency virus infection) (HCC) 10/12/2017  . Hyperlipidemia    Past Surgical History:  Procedure Laterality  Date  . HERNIA REPAIR     Social History   Tobacco Use  . Smoking status: Never Smoker  . Smokeless tobacco: Never Used  Vaping Use  . Vaping Use: Never used  Substance Use Topics  . Alcohol use: No  . Drug use: No   Family History  Problem Relation Age of Onset  . Diabetes Mother   . Stomach cancer Sister   . Pancreatic cancer Brother   . Prostate cancer Neg Hx   . Bladder Cancer Neg Hx   . Kidney cancer Neg Hx    No Known Allergies Current Outpatient Medications on File Prior to Visit  Medication Sig Dispense Refill  . BIKTARVY 50-200-25 MG TABS tablet TAKE ONE TABLET BY MOUTH ONCE DAILY. STORE AT ROOM TEMPERATURE. MAY TAKE WITH OR WITHOUT FOOD. 90 tablet 2  . glipiZIDE (GLUCOTROL XL) 5 MG 24 hr tablet Take 1 tablet (5 mg total) by mouth daily with breakfast. 90 tablet 1  . losartan  (COZAAR) 50 MG tablet Take 1 tablet (50 mg total) by mouth daily. 90 tablet 3  . metFORMIN (GLUCOPHAGE XR) 500 MG 24 hr tablet Take 2 tablets (1,000 mg total) by mouth daily with breakfast. 180 tablet 3  . rosuvastatin (CRESTOR) 20 MG tablet Take 1 tablet (20 mg total) by mouth daily. 90 tablet 3  . tadalafil (CIALIS) 20 MG tablet 1 tablet 1 hour prior to intercourse 30 tablet 3   No current facility-administered medications on file prior to visit.    Review of Systems  Constitutional: Negative for activity change, appetite change, fatigue, fever and unexpected weight change.  HENT: Negative for congestion, rhinorrhea, sore throat and trouble swallowing.   Eyes: Negative for pain, redness, itching and visual disturbance.  Respiratory: Negative for cough, chest tightness, shortness of breath and wheezing.   Cardiovascular: Negative for chest pain and palpitations.  Gastrointestinal: Negative for abdominal pain, blood in stool, constipation, diarrhea and nausea.  Endocrine: Negative for cold intolerance, heat intolerance, polydipsia and polyuria.  Genitourinary: Negative for difficulty urinating, dysuria, frequency and urgency.  Musculoskeletal: Negative for arthralgias, joint swelling and myalgias.  Skin: Negative for pallor and rash.  Neurological: Negative for dizziness, tremors, weakness, numbness and headaches.  Hematological: Negative for adenopathy. Does not bruise/bleed easily.  Psychiatric/Behavioral: Negative for decreased concentration and dysphoric mood. The patient is not nervous/anxious.        Objective:   Physical Exam Constitutional:      General: He is not in acute distress.    Appearance: Normal appearance. He is well-developed, normal weight and well-nourished. He is not ill-appearing.  HENT:     Head: Normocephalic and atraumatic.     Mouth/Throat:     Mouth: Oropharynx is clear and moist.  Eyes:     Extraocular Movements: EOM normal.     Conjunctiva/sclera:  Conjunctivae normal.     Pupils: Pupils are equal, round, and reactive to light.  Neck:     Thyroid: No thyromegaly.     Vascular: No carotid bruit or JVD.  Cardiovascular:     Rate and Rhythm: Normal rate and regular rhythm.     Pulses: Intact distal pulses.     Heart sounds: Normal heart sounds. No gallop.   Pulmonary:     Effort: Pulmonary effort is normal. No respiratory distress.     Breath sounds: Normal breath sounds. No wheezing or rales.     Comments: No crackles Abdominal:     General: Bowel sounds are normal. There  is no distension or abdominal bruit.     Palpations: Abdomen is soft. There is no mass.     Tenderness: There is no abdominal tenderness.  Musculoskeletal:        General: No edema.     Cervical back: Normal range of motion and neck supple.  Lymphadenopathy:     Cervical: No cervical adenopathy.  Skin:    General: Skin is warm and dry.     Coloration: Skin is not pale.     Findings: No erythema or rash.  Neurological:     Mental Status: He is alert.     Sensory: No sensory deficit.     Coordination: Coordination normal.     Deep Tendon Reflexes: Reflexes are normal and symmetric. Reflexes normal.  Psychiatric:        Mood and Affect: Mood and affect and mood normal.        Cognition and Memory: Cognition and memory normal.           Assessment & Plan:   Problem List Items Addressed This Visit      Cardiovascular and Mediastinum   Essential hypertension    bp has been up and down without symptoms BP Readings from Last 1 Encounters:  07/14/20 (!) 151/78   May consider inc losartan-pending labs first  Most recent labs reviewed /new ordered  Disc lifstyle change with low sodium diet and exercise (lifestyle habits are good)       Relevant Orders   CBC with Differential/Platelet (Completed)   Comprehensive metabolic panel (Completed)   Lipid panel (Completed)   TSH (Completed)   Erectile dysfunction due to arterial insufficiency    Sees  urology/ Dr Lonna Cobb Takes cialis        Endocrine   Type 2 diabetes mellitus without complication, without long-term current use of insulin (HCC) - Primary    A1C today  Last was 8.8  Lifestyle habits are better and pt has lost some weight since then  Glucose readings at home are higher in am - 160s-200s  Taking metformin xr 1000 mg daily  Glipizide xl 5 mg daily on no reported low glucose readings  Nl foot exam Eye exam utd 7/21 Takes statin and arb  After labs - will likely inc glipizide dose and then decide f/u      Relevant Orders   Hemoglobin A1c (Completed)   Hyperlipidemia associated with type 2 diabetes mellitus (HCC)    Disc goals for lipids and reasons to control them Rev last labs with pt Rev low sat fat diet in detail Taking crestor 20 mg daily  Lab today      Relevant Orders   Lipid panel (Completed)

## 2020-07-14 NOTE — Assessment & Plan Note (Signed)
Disc goals for lipids and reasons to control them Rev last labs with pt Rev low sat fat diet in detail Taking crestor 20 mg daily  Lab today

## 2020-07-14 NOTE — Assessment & Plan Note (Signed)
bp has been up and down without symptoms BP Readings from Last 1 Encounters:  07/14/20 (!) 151/78   May consider inc losartan-pending labs first  Most recent labs reviewed /new ordered  Disc lifstyle change with low sodium diet and exercise (lifestyle habits are good)

## 2020-07-14 NOTE — Assessment & Plan Note (Signed)
A1C today  Last was 8.8  Lifestyle habits are better and pt has lost some weight since then  Glucose readings at home are higher in am - 160s-200s  Taking metformin xr 1000 mg daily  Glipizide xl 5 mg daily on no reported low glucose readings  Nl foot exam Eye exam utd 7/21 Takes statin and arb  After labs - will likely inc glipizide dose and then decide f/u

## 2020-07-14 NOTE — Patient Instructions (Addendum)
Keep eating healthy and exercising  Drink your water   Labs today  We will decide what to change based on labs   Most likely we will see you back in about 3 months

## 2020-07-17 ENCOUNTER — Telehealth: Payer: Self-pay | Admitting: *Deleted

## 2020-07-17 MED ORDER — GLIPIZIDE ER 10 MG PO TB24: 10.0000 mg | ORAL_TABLET | Freq: Every day | ORAL | 3 refills | Status: DC

## 2020-07-17 NOTE — Telephone Encounter (Signed)
-----   Message from Judy Pimple, MD sent at 07/14/2020  8:34 PM EST ----- Diabetes control is worse than expected  A1C is up to 11  Please increase glipizide xl to 10 mg daily (call in 30 with 3 refills)  Check glucose bid (fasting and 2 h after a meal)  F/u with glucose record in about a month  If he can get a list of covered medicines (from ins) for diabetes medications please bring that also  Keep working on good diet/exercise as he has been  Other labs are stable and cholesterol is fairly controlled (trig are high most likely dut to high sugar)  We will re check bp at his f/u appt

## 2020-07-17 NOTE — Telephone Encounter (Signed)
Pt notified of lab results and Dr. Royden Purl comments, Rx sent to pharmacy. Pt was driving so he will call back next week to schedule f/u appt with PCP

## 2020-07-22 ENCOUNTER — Other Ambulatory Visit: Payer: Self-pay

## 2020-07-22 DIAGNOSIS — E291 Testicular hypofunction: Secondary | ICD-10-CM

## 2020-07-24 ENCOUNTER — Other Ambulatory Visit: Payer: BC Managed Care – PPO

## 2020-07-27 ENCOUNTER — Other Ambulatory Visit: Payer: Self-pay

## 2020-07-27 ENCOUNTER — Other Ambulatory Visit: Payer: BC Managed Care – PPO

## 2020-07-27 DIAGNOSIS — E291 Testicular hypofunction: Secondary | ICD-10-CM | POA: Diagnosis not present

## 2020-07-28 LAB — PSA: Prostate Specific Ag, Serum: 0.8 ng/mL (ref 0.0–4.0)

## 2020-07-28 LAB — TESTOSTERONE: Testosterone: 272 ng/dL (ref 264–916)

## 2020-07-31 ENCOUNTER — Ambulatory Visit: Payer: BC Managed Care – PPO | Admitting: Urology

## 2020-08-10 ENCOUNTER — Encounter: Payer: Self-pay | Admitting: Urology

## 2020-08-10 ENCOUNTER — Other Ambulatory Visit: Payer: Self-pay

## 2020-08-10 ENCOUNTER — Ambulatory Visit (INDEPENDENT_AMBULATORY_CARE_PROVIDER_SITE_OTHER): Payer: BC Managed Care – PPO | Admitting: Urology

## 2020-08-10 VITALS — BP 132/76 | HR 77 | Ht 66.0 in | Wt 166.0 lb

## 2020-08-10 DIAGNOSIS — N5201 Erectile dysfunction due to arterial insufficiency: Secondary | ICD-10-CM | POA: Diagnosis not present

## 2020-08-10 MED ORDER — TADALAFIL 20 MG PO TABS: ORAL_TABLET | ORAL | 2 refills | Status: DC

## 2020-08-10 NOTE — Progress Notes (Signed)
   08/10/2020 8:28 AM   Clinton Watts Nov 17, 1955 [id number]  Referring provider: Emi Belfast, FNP No address on file  Chief Complaint  Patient presents with  . Erectile Dysfunction    HPI: 65 y.o. male presents for annual follow-up.   Followed for ED and at visit last year was switched from sildenafil to tadalafil  He feels the tadalafil works better no get some erections and has difficulty maintaining  PSA 07/27/2020 stable at 0.8  History hypogonadism but does not desire TRT   PMH: Past Medical History:  Diagnosis Date  . Diabetes mellitus without complication (HCC)   . ED (erectile dysfunction)   . HIV (human immunodeficiency virus infection) (HCC) 10/12/2017  . Hyperlipidemia     Surgical History: Past Surgical History:  Procedure Laterality Date  . HERNIA REPAIR      Home Medications:  Allergies as of 08/10/2020   No Known Allergies     Medication List       Accurate as of August 10, 2020  8:28 AM. If you have any questions, ask your nurse or doctor.        Biktarvy 50-200-25 MG Tabs tablet Generic drug: bictegravir-emtricitabine-tenofovir AF TAKE ONE TABLET BY MOUTH ONCE DAILY. STORE AT ROOM TEMPERATURE. MAY TAKE WITH OR WITHOUT FOOD.   glipiZIDE 10 MG 24 hr tablet Commonly known as: GLUCOTROL XL Take 1 tablet (10 mg total) by mouth daily with breakfast.   losartan 50 MG tablet Commonly known as: COZAAR Take 1 tablet (50 mg total) by mouth daily.   metFORMIN 500 MG 24 hr tablet Commonly known as: Glucophage XR Take 2 tablets (1,000 mg total) by mouth daily with breakfast.   rosuvastatin 20 MG tablet Commonly known as: CRESTOR Take 1 tablet (20 mg total) by mouth daily.   tadalafil 20 MG tablet Commonly known as: CIALIS 1 tablet 1 hour prior to intercourse       Allergies: No Known Allergies  Family History: Family History  Problem Relation Age of Onset  . Diabetes Mother   . Stomach cancer Sister   . Pancreatic cancer  Brother   . Prostate cancer Neg Hx   . Bladder Cancer Neg Hx   . Kidney cancer Neg Hx     Social History:  reports that he has never smoked. He has never used smokeless tobacco. He reports that he does not drink alcohol and does not use drugs.   Physical Exam: BP 132/76   Pulse 77   Ht 5\' 6"  (1.676 m)   Wt 166 lb (75.3 kg)   BMI 26.79 kg/m   Constitutional:  Alert and oriented, No acute distress. HEENT: [place] AT, moist mucus membranes.  Trachea midline, no masses. Cardiovascular: No clubbing, cyanosis, or edema. Respiratory: Normal respiratory effort, no increased work of breathing. GU: Declined DRE today Psychiatric: Normal mood and affect.    Assessment & Plan:    1.  Erectile dysfunction  Second line options were discussed including vacuum erection devices and intracavernosal injections  Penile implant surgery was discussed  He is not interested in these other options.  We did discuss the availability of a venous compression band which may improve his erections in conjunction with tadalafil and he was provided information on an adjustable compression band  Continue annual follow-up  2.  Prostate cancer screening  PSA remains low and stable  , MD  [facility] [address] , [place] [phone]

## 2020-09-09 ENCOUNTER — Other Ambulatory Visit: Payer: Self-pay | Admitting: Urology

## 2020-09-21 ENCOUNTER — Ambulatory Visit: Payer: BC Managed Care – PPO | Admitting: Family Medicine

## 2020-09-21 ENCOUNTER — Other Ambulatory Visit: Payer: Self-pay | Admitting: Infectious Diseases

## 2020-09-21 DIAGNOSIS — B20 Human immunodeficiency virus [HIV] disease: Secondary | ICD-10-CM

## 2020-09-23 ENCOUNTER — Other Ambulatory Visit (HOSPITAL_COMMUNITY)
Admission: RE | Admit: 2020-09-23 | Discharge: 2020-09-23 | Disposition: A | Discharge status: Home / Self Care | Payer: BC Managed Care – PPO | Source: Ambulatory Visit | Attending: Infectious Diseases | Admitting: Infectious Diseases

## 2020-09-23 ENCOUNTER — Other Ambulatory Visit: Payer: BC Managed Care – PPO

## 2020-09-23 ENCOUNTER — Other Ambulatory Visit: Payer: Self-pay

## 2020-09-23 DIAGNOSIS — Z21 Asymptomatic human immunodeficiency virus [HIV] infection status: Secondary | ICD-10-CM | POA: Insufficient documentation

## 2020-09-23 DIAGNOSIS — B20 Human immunodeficiency virus [HIV] disease: Secondary | ICD-10-CM | POA: Diagnosis not present

## 2020-09-24 ENCOUNTER — Other Ambulatory Visit: Payer: Self-pay | Admitting: Infectious Diseases

## 2020-09-24 DIAGNOSIS — B20 Human immunodeficiency virus [HIV] disease: Secondary | ICD-10-CM

## 2020-09-24 LAB — URINE CYTOLOGY ANCILLARY ONLY
Chlamydia: NEGATIVE
Comment: NEGATIVE
Comment: NORMAL
Neisseria Gonorrhea: NEGATIVE

## 2020-09-24 LAB — T-HELPER CELL (CD4) - (RCID CLINIC ONLY)
CD4 % Helper T Cell: 24 % — ABNORMAL LOW (ref 33–65)
CD4 T Cell Abs: 404 /uL (ref 400–1790)

## 2020-09-26 LAB — COMPLETE METABOLIC PANEL WITH GFR
AG Ratio: 1.5 (calc) (ref 1.0–2.5)
ALT: 13 U/L (ref 9–46)
AST: 21 U/L (ref 10–35)
Albumin: 4.8 g/dL (ref 3.6–5.1)
Alkaline phosphatase (APISO): 63 U/L (ref 35–144)
BUN: 22 mg/dL (ref 7–25)
CO2: 29 mmol/L (ref 20–32)
Calcium: 10.5 mg/dL — ABNORMAL HIGH (ref 8.6–10.3)
Chloride: 101 mmol/L (ref 98–110)
Creat: 1.1 mg/dL (ref 0.70–1.25)
GFR, Est African American: 81 mL/min/{1.73_m2} (ref >=60)
GFR, Est Non African American: 70 mL/min/{1.73_m2} (ref >=60)
Globulin: 3.3 g/dL (calc) (ref 1.9–3.7)
Glucose, Bld: 123 mg/dL — ABNORMAL HIGH (ref 65–99)
Potassium: 4.6 mmol/L (ref 3.5–5.3)
Sodium: 138 mmol/L (ref 135–146)
Total Bilirubin: 0.6 mg/dL (ref 0.2–1.2)
Total Protein: 8.1 g/dL (ref 6.1–8.1)

## 2020-09-26 LAB — HIV-1 RNA QUANT-NO REFLEX-BLD
HIV 1 RNA Quant: <20 Copies/mL — ABNORMAL HIGH
HIV-1 RNA Quant, Log: <1.3 Log cps/mL — ABNORMAL HIGH

## 2020-09-26 LAB — RPR: RPR Ser Ql: NONREACTIVE

## 2020-09-28 ENCOUNTER — Telehealth: Payer: Self-pay | Admitting: Family Medicine

## 2020-09-28 DIAGNOSIS — E119 Type 2 diabetes mellitus without complications: Secondary | ICD-10-CM

## 2020-09-28 MED ORDER — METFORMIN HCL ER 500 MG PO TB24
1000.0000 mg | ORAL_TABLET | Freq: Every day | ORAL | 0 refills | Status: DC
Start: 2020-09-28 — End: 2020-10-08

## 2020-09-28 NOTE — Telephone Encounter (Signed)
  LAST APPOINTMENT DATE: 07/17/2020   NEXT APPOINTMENT DATE:@4 /01/2021  MEDICATION: metformin 500mg   PHARMACY: [facility]   Let patient know to contact pharmacy at the end of the day to make sure medication is ready.  Please notify patient to allow 48-72 hours to process  Encourage patient to contact the pharmacy for refills or they can request refills through [facility]  CLINICAL FILLS OUT ALL BELOW:   LAST REFILL:  QTY:  REFILL DATE:    OTHER COMMENTS:    Okay for refill?  Please advise

## 2020-09-28 NOTE — Telephone Encounter (Signed)
Rx sent 

## 2020-10-08 ENCOUNTER — Encounter: Payer: Self-pay | Admitting: Family Medicine

## 2020-10-08 ENCOUNTER — Other Ambulatory Visit: Payer: Self-pay

## 2020-10-08 ENCOUNTER — Ambulatory Visit (INDEPENDENT_AMBULATORY_CARE_PROVIDER_SITE_OTHER): Payer: BC Managed Care – PPO | Admitting: Family Medicine

## 2020-10-08 ENCOUNTER — Other Ambulatory Visit: Payer: Self-pay | Admitting: Family Medicine

## 2020-10-08 VITALS — BP 146/84 | HR 66 | Temp 96.9°F | Ht 65.5 in | Wt 169.0 lb

## 2020-10-08 DIAGNOSIS — E785 Hyperlipidemia, unspecified: Secondary | ICD-10-CM | POA: Diagnosis not present

## 2020-10-08 DIAGNOSIS — I10 Essential (primary) hypertension: Secondary | ICD-10-CM | POA: Diagnosis not present

## 2020-10-08 DIAGNOSIS — E119 Type 2 diabetes mellitus without complications: Secondary | ICD-10-CM | POA: Diagnosis not present

## 2020-10-08 DIAGNOSIS — E1169 Type 2 diabetes mellitus with other specified complication: Secondary | ICD-10-CM

## 2020-10-08 MED ORDER — METFORMIN HCL ER 500 MG PO TB24: 1000.0000 mg | ORAL_TABLET | Freq: Every day | ORAL | 3 refills | Status: DC

## 2020-10-08 MED ORDER — GLIPIZIDE ER 10 MG PO TB24: 10.0000 mg | ORAL_TABLET | Freq: Every day | ORAL | 3 refills | Status: DC

## 2020-10-08 MED ORDER — LOSARTAN POTASSIUM 100 MG PO TABS: 100.0000 mg | ORAL_TABLET | Freq: Every day | ORAL | 3 refills | Status: DC

## 2020-10-08 NOTE — Assessment & Plan Note (Signed)
Lab Results  Component Value Date   HGBA1C 11.0 (H) 07/14/2020   Since that time pt has greatly changed eating and glipizide xl dose inc to 10 mg daily  Continues metformin xr 1000 mg daily  Glucose readings are much better! Expect much improvement with next a1c at end of the mo Will watch for hypoglycemia  F/u 4 mo for visit  Eye exam utd On ace and statin

## 2020-10-08 NOTE — Patient Instructions (Addendum)
Go up on your losartan to 100 mg daily   Other medicines -no changes  Drink water  Keep watching diet and exercising   Let's check labs in late April   Follow up with me in 4 months

## 2020-10-08 NOTE — Assessment & Plan Note (Signed)
Disc goals for lipids and reasons to control them Rev last labs with pt Rev low sat fat diet in detail LDL is down to 67 -at goal Will continue crestor 20 mg daily

## 2020-10-08 NOTE — Assessment & Plan Note (Signed)
bp in fair control at this time  BP Readings from Last 1 Encounters:  10/08/20 (!) 146/84   No changes needed Most recent labs reviewed  Disc lifstyle change with low sodium diet and exercise  Plan to increase losartan from 50 mg to 100 mg daily  Lab end of the month for renal  Will alert if any problems F/u for visit in 4 mo

## 2020-10-08 NOTE — Progress Notes (Signed)
Subjective:    Patient ID: Clinton Watts, male    DOB: 29-Mar-1956, 65 y.o.   MRN: [id number]  This visit occurred during the SARS-CoV-2 public health emergency.  Safety protocols were in place, including screening questions prior to the visit, additional usage of staff PPE, and extensive cleaning of exam room while observing appropriate contact time as indicated for disinfecting solutions.    HPI Pt presents for f/u of DM2  Wt Readings from Last 3 Encounters:  10/08/20 169 lb (76.7 kg)  08/10/20 166 lb (75.3 kg)  07/14/20 165 lb 7 oz (75 kg)   27.70 kg/m   HTN bp is stable today  No cp or palpitations or headaches or edema  No side effects to medicines  BP Readings from Last 3 Encounters:  10/08/20 (!) 146/84  08/10/20 132/76  07/14/20 (!) 151/78     Losartan 50 mg daily   Lab Results  Component Value Date   CREATININE 1.10 09/23/2020   BUN 22 09/23/2020   NA 138 09/23/2020   K 4.6 09/23/2020   CL 101 09/23/2020   CO2 29 09/23/2020    DM2 Lab Results  Component Value Date   HGBA1C 11.0 (H) 07/14/2020  taking glipidzide xl 10 mg daily  Metformin xr 500 mg (2 once daily)   He has checked his glucose  It is coming down with inc glipizide   Am - fasting 109 to 120s  At lunch time in the 90s  In the evenings usually around 125  Nothing over 200 since he started the new dose of glipizde  Feeling ok   Also eating better  Avoiding greasy food  Avoids sweets (has occ)  Eating a lot of salmon and vegetables   Active - walking and jogging    Eye exam utd   Hyperlipidemia Lab Results  Component Value Date   CHOL 136 07/14/2020   HDL 30.70 (L) 07/14/2020   LDLCALC 51 08/12/2019   LDLDIRECT 67.0 07/14/2020   TRIG 300.0 (H) 07/14/2020   CHOLHDL 4 07/14/2020   Taking crestor 20 mg daily   Patient Active Problem List   Diagnosis Date Noted  . Erectile dysfunction due to arterial insufficiency 07/30/2019  . Hypogonadism in male 07/13/2019  . Essential  hypertension 01/22/2018  . Human immunodeficiency virus (HIV) disease (HCC) 10/12/2017  . Healthcare maintenance 10/12/2017  . Erectile dysfunction 10/12/2017  . History of hernia repair 08/26/2017  . Type 2 diabetes mellitus without complication, without long-term current use of insulin (HCC) 08/26/2017  . Hyperlipidemia associated with type 2 diabetes mellitus (HCC) 08/26/2017   Past Medical History:  Diagnosis Date  . Diabetes mellitus without complication (HCC)   . ED (erectile dysfunction)   . HIV (human immunodeficiency virus infection) (HCC) 10/12/2017  . Hyperlipidemia    Past Surgical History:  Procedure Laterality Date  . HERNIA REPAIR     Social History   Tobacco Use  . Smoking status: Never Smoker  . Smokeless tobacco: Never Used  Vaping Use  . Vaping Use: Never used  Substance Use Topics  . Alcohol use: No  . Drug use: No   Family History  Problem Relation Age of Onset  . Diabetes Mother   . Stomach cancer Sister   . Pancreatic cancer Brother   . Prostate cancer Neg Hx   . Bladder Cancer Neg Hx   . Kidney cancer Neg Hx    No Known Allergies Current Outpatient Medications on File Prior to Visit  Medication Sig  Dispense Refill  . BIKTARVY 50-200-25 MG TABS tablet TAKE ONE TABLET BY MOUTH ONCE DAILY. STORE AT ROOM TEMPERATURE. MAY TAKE WITH OR WITHOUT FOOD. 30 tablet 0  . rosuvastatin (CRESTOR) 20 MG tablet Take 1 tablet (20 mg total) by mouth daily. 90 tablet 3  . sildenafil (REVATIO) 20 MG tablet TAKE 2 TO 5 TABLETS ONE HOUR PRIOR TO INTERCOURSE 60 tablet 0  . tadalafil (CIALIS) 20 MG tablet 1 tablet 1 hour prior to intercourse 60 tablet 2   No current facility-administered medications on file prior to visit.    Review of Systems  Constitutional: Negative for activity change, appetite change, fatigue, fever and unexpected weight change.  HENT: Negative for congestion, rhinorrhea, sore throat and trouble swallowing.   Eyes: Negative for pain, redness,  itching and visual disturbance.  Respiratory: Negative for cough, chest tightness, shortness of breath and wheezing.   Cardiovascular: Negative for chest pain and palpitations.  Gastrointestinal: Negative for abdominal pain, blood in stool, constipation, diarrhea and nausea.  Endocrine: Negative for cold intolerance, heat intolerance, polydipsia and polyuria.  Genitourinary: Negative for difficulty urinating, dysuria, frequency and urgency.  Musculoskeletal: Negative for arthralgias, joint swelling and myalgias.  Skin: Negative for pallor and rash.  Neurological: Negative for dizziness, tremors, weakness, numbness and headaches.  Hematological: Negative for adenopathy. Does not bruise/bleed easily.  Psychiatric/Behavioral: Negative for decreased concentration and dysphoric mood. The patient is not nervous/anxious.        Objective:   Physical Exam Constitutional:      General: He is not in acute distress.    Appearance: Normal appearance. He is well-developed and normal weight.  HENT:     Head: Normocephalic and atraumatic.  Eyes:     Conjunctiva/sclera: Conjunctivae normal.     Pupils: Pupils are equal, round, and reactive to light.  Neck:     Thyroid: No thyromegaly.     Vascular: No carotid bruit or JVD.  Cardiovascular:     Rate and Rhythm: Normal rate and regular rhythm.     Heart sounds: Normal heart sounds. No gallop.   Pulmonary:     Effort: Pulmonary effort is normal. No respiratory distress.     Breath sounds: Normal breath sounds. No wheezing or rales.  Abdominal:     General: Bowel sounds are normal. There is no distension or abdominal bruit.     Palpations: Abdomen is soft. There is no mass.     Tenderness: There is no abdominal tenderness.  Musculoskeletal:     Cervical back: Normal range of motion and neck supple.     Right lower leg: No edema.     Left lower leg: No edema.  Lymphadenopathy:     Cervical: No cervical adenopathy.  Skin:    General: Skin is  warm and dry.     Findings: No rash.  Neurological:     Mental Status: He is alert.     Sensory: No sensory deficit.     Coordination: Coordination normal.     Deep Tendon Reflexes: Reflexes are normal and symmetric.  Psychiatric:        Mood and Affect: Mood normal.           Assessment & Plan:   Problem List Items Addressed This Visit      Cardiovascular and Mediastinum   Essential hypertension    bp in fair control at this time  BP Readings from Last 1 Encounters:  10/08/20 (!) 146/84   No changes needed Most recent labs  reviewed  Disc lifstyle change with low sodium diet and exercise  Plan to increase losartan from 50 mg to 100 mg daily  Lab end of the month for renal  Will alert if any problems F/u for visit in 4 mo      Relevant Medications   losartan (COZAAR) 100 MG tablet     Endocrine   Type 2 diabetes mellitus without complication, without long-term current use of insulin (HCC) - Primary    Lab Results  Component Value Date   HGBA1C 11.0 (H) 07/14/2020   Since that time pt has greatly changed eating and glipizide xl dose inc to 10 mg daily  Continues metformin xr 1000 mg daily  Glucose readings are much better! Expect much improvement with next a1c at end of the mo Will watch for hypoglycemia  F/u 4 mo for visit  Eye exam utd On ace and statin       Relevant Medications   losartan (COZAAR) 100 MG tablet   metFORMIN (GLUCOPHAGE XR) 500 MG 24 hr tablet   glipiZIDE (GLUCOTROL XL) 10 MG 24 hr tablet   Hyperlipidemia associated with type 2 diabetes mellitus (HCC)    Disc goals for lipids and reasons to control them Rev last labs with pt Rev low sat fat diet in detail LDL is down to 67 -at goal Will continue crestor 20 mg daily       Relevant Medications   losartan (COZAAR) 100 MG tablet   metFORMIN (GLUCOPHAGE XR) 500 MG 24 hr tablet   glipiZIDE (GLUCOTROL XL) 10 MG 24 hr tablet

## 2020-10-09 ENCOUNTER — Ambulatory Visit (INDEPENDENT_AMBULATORY_CARE_PROVIDER_SITE_OTHER): Payer: BC Managed Care – PPO | Admitting: Infectious Diseases

## 2020-10-09 ENCOUNTER — Encounter: Payer: Self-pay | Admitting: Infectious Diseases

## 2020-10-09 VITALS — BP 150/70 | HR 76 | Temp 98.0°F | Wt 170.0 lb

## 2020-10-09 DIAGNOSIS — Z23 Encounter for immunization: Secondary | ICD-10-CM

## 2020-10-09 DIAGNOSIS — Z5181 Encounter for therapeutic drug level monitoring: Secondary | ICD-10-CM | POA: Diagnosis not present

## 2020-10-09 DIAGNOSIS — Z0000 Encounter for general adult medical examination without abnormal findings: Secondary | ICD-10-CM

## 2020-10-09 DIAGNOSIS — B20 Human immunodeficiency virus [HIV] disease: Secondary | ICD-10-CM

## 2020-10-09 MED ORDER — BIKTARVY 50-200-25 MG PO TABS: 1.0000 | ORAL_TABLET | Freq: Every day | ORAL | 3 refills | Status: DC

## 2020-10-09 NOTE — Progress Notes (Signed)
Name: Garold Sheeler  ENI:[id number]   DOB: 04-03-1956   PCP: Judy Pimple, MD    Subjective    Clinton Watts is a 65 y.o. male HIV disease Dx 98. Originally from [place].  HIV Risk: heterosexual contact. Previously in care with [facility] in [place] [phone]).  Hx OIs: none known    Previous Regimens:  Combivir + Crixivan  Atripla --> well controlled and undetectable for years, switched for TAF regimen  Biktarvy 10/2017 - suppressed  Genotypes:  None on file or from previous provider    HPI:  Clinton Watts is here for 1 year follow-up.  Clinton Watts reports no general concerns aside from needing a 90-day refill on his Biktarvy.  Clinton Watts continues to take this once daily without missed doses.  No concern for side effects or access to his medicine.  Clinton Watts is pleased with his weight loss and improved control of her diabetes care as of late.  His wife is doing well.  They are planning a trip back to [place] in the summertime to visit with family.   No concern over anxious or depressed mood.  Clinton Watts is eating and sleeping well.  Working with a new primary care provider.    Medical/surgical/social/family history have been updated during today's visit.     Review of Systems  Constitutional: Negative for appetite change, chills, fatigue, fever and unexpected weight change.  Eyes: Negative for visual disturbance.  Respiratory: Negative for cough and shortness of breath.   Cardiovascular: Negative for chest pain and leg swelling.  Gastrointestinal: Negative for abdominal pain, diarrhea and nausea.  Genitourinary: Negative for dysuria, genital sores and penile discharge.  Musculoskeletal: Negative for joint swelling.  Skin: Negative for color change and rash.  Neurological: Negative for dizziness and headaches.  Hematological: Negative for adenopathy.  Psychiatric/Behavioral: Negative for sleep disturbance. The patient is not nervous/anxious.      Objective    Vitals:   10/09/20 0922   BP: (!) 150/70  Pulse: 76  Temp: 98 F (36.7 C)   Estimated body mass index is 27.86 kg/m as calculated from the following:   Height as of 10/08/20: 5' 5.5" (1.664 m).   Weight as of this encounter: 170 lb (77.1 kg).      Physical Exam Vitals and nursing note reviewed.  Constitutional:      Appearance: Clinton Watts is well-developed.     Comments: Seated comfortably in chair during visit.   HENT:     Mouth/Throat:     Dentition: Normal dentition. No dental abscesses.  Cardiovascular:     Rate and Rhythm: Normal rate and regular rhythm.     Heart sounds: Normal heart sounds.  Pulmonary:     Effort: Pulmonary effort is normal.     Breath sounds: Normal breath sounds.  Abdominal:     General: There is no distension.     Palpations: Abdomen is soft.     Tenderness: There is no abdominal tenderness.  Lymphadenopathy:     Cervical: No cervical adenopathy.  Skin:    General: Skin is warm and dry.     Findings: No rash.  Neurological:     Mental Status: Clinton Watts is alert and oriented to person, place, and time.  Psychiatric:        Judgment: Judgment normal.     Comments: In good spirits today and engaged in care discussion.      LABS:  HIV 1 RNA Quant  Date Value  09/23/2020 <20 Copies/mL (H)  08/12/2019 <  20 NOT DETECTED copies/mL  09/19/2018 <20 DETECTED copies/mL (A)   CD4 T Cell Abs (/uL)  Date Value  09/23/2020 404  08/12/2019 502  09/19/2018 500    Lab Results  Component Value Date   CREATININE 1.10 09/23/2020   CREATININE 1.09 07/14/2020   CREATININE 1.00 08/12/2019    Lab Results  Component Value Date   WBC 6.6 07/14/2020   HGB 13.4 07/14/2020   HCT 41.4 07/14/2020   MCV 65.8 Repeated and verified X2. (L) 07/14/2020   PLT 226.0 07/14/2020    Lab Results  Component Value Date   ALT 13 09/23/2020   AST 21 09/23/2020   ALKPHOS 101 07/14/2020   BILITOT 0.6 09/23/2020     Assessment and Plan: Problem List Items Addressed This Visit      Unprioritized    Therapeutic drug monitoring    Lab Results  Component Value Date   CREATININE 1.10 09/23/2020   Kidney function appears to be doing well and stable.  We will continue to monitor.  His primary care provider has epic access.  Will prepare to only check HIV pertinent lab work next time. No changes.      Human immunodeficiency virus (HIV) disease (HCC)    Well-controlled on once daily Biktarvy.  Most recently his viral load is undetectable and CD4 count 400.  We spent some time discussing newer treatment regimens including the injectable cavity of a.  Given his treatment history and previous medications I would feel more comfortable to have him on a 3 drug regimen and continue Biktarvy once daily.  Clinton Watts is happy to continue doing this and would prefer not to have to come to the office more often for injections. In a monogamous relationship and no need for STI screening today.  Clinton Watts continues to be undetectable making his wife's risk for acquiring infection zero.   Return in about 1 year (around 10/09/2021).       Relevant Medications   bictegravir-emtricitabine-tenofovir AF (BIKTARVY) 50-200-25 MG TABS tablet   Other Relevant Orders   HIV-1 RNA quant-no reflex-bld   T-helper cell (CD4)- ([facility] clinic only)   Healthcare maintenance    Counseled regarding updates on fourth Pfizer booster.  We provided this to him today in the clinic.  Tolerated well. I also recommended Clinton Watts consider getting the Shingrix vaccine if Clinton Watts is not already to help reduce the risk of shingles for him going forward. Has a primary care team to help with wellness and preventative maintenance.       Other Visit Diagnoses    Encounter for immunization    -  Primary   Relevant Orders   PFIZER Comirnaty(GRAY TOP)COVID-19 Vaccine (Completed)      Rexene Alberts, MSN, NP-C [facility]  [email or web address] Pager: [phone] Office: [phone] [facility] Main  Line: [phone]

## 2020-10-09 NOTE — Progress Notes (Signed)
   Covid-19 Vaccination Clinic  Name:  Clinton Watts    MRN: [id number] DOB: 08/15/1955  10/09/2020  Mr. Caltagirone was observed post Covid-19 immunization for 15 minutes without incident. He was provided with Vaccine Information Sheet and instruction to access the V-Safe system.   Mr. Strother was instructed to call 911 with any severe reactions post vaccine: Marland Kitchen Difficulty breathing  . Swelling of face and throat  . A fast heartbeat  . A bad rash all over body  . Dizziness and weakness  Sherill Mangen T Pricilla Loveless

## 2020-10-09 NOTE — Assessment & Plan Note (Signed)
Well-controlled on once daily Biktarvy.  Most recently his viral load is undetectable and CD4 count 400.  We spent some time discussing newer treatment regimens including the injectable cavity of a.  Given his treatment history and previous medications I would feel more comfortable to have him on a 3 drug regimen and continue Biktarvy once daily.  He is happy to continue doing this and would prefer not to have to come to the office more often for injections. In a monogamous relationship and no need for STI screening today.  He continues to be undetectable making his wife's risk for acquiring infection zero.   Return in about 1 year (around 10/09/2021).

## 2020-10-09 NOTE — Assessment & Plan Note (Addendum)
Counseled regarding updates on fourth Pfizer booster.  We provided this to him today in the clinic.  Tolerated well. I also recommended he consider getting the Shingrix vaccine if he is not already to help reduce the risk of shingles for him going forward. Has a primary care team to help with wellness and preventative maintenance.

## 2020-10-09 NOTE — Patient Instructions (Signed)
So nice to see you today!  I sent in refills for the biktarvy 90 day supply. Everything looks perfect.   Please plan to return in 1 year for routine care with blood work prior to.   We gave you your 4th Pfizer shot today.  I would consider the Shingles vaccine (Shingrix) if you have not had this one. It is available with a prescription at local pharmacies all over. It works very well to prevent shingles and would recommend you consider that.    Enjoy your trip to [place]!

## 2020-10-09 NOTE — Assessment & Plan Note (Signed)
Lab Results  Component Value Date   CREATININE 1.10 09/23/2020   Kidney function appears to be doing well and stable.  We will continue to monitor.  His primary care provider has epic access.  Will prepare to only check HIV pertinent lab work next time. No changes.

## 2020-10-14 ENCOUNTER — Ambulatory Visit: Payer: BC Managed Care – PPO | Admitting: Family Medicine

## 2020-10-20 ENCOUNTER — Telehealth: Payer: Self-pay

## 2020-10-20 NOTE — Telephone Encounter (Signed)
Received call from patient stating that [facility] was unable to fill his Biktarvy as a 90 day supply. [facility] called [facility], they have the prescription on file as a 90 day supply, but they state that patient's insurance will no longer cover a 90 day supply due to a high copay. RN called patient back to notify him that only a 30 supply would be covered and that he should contact his insurance company. [facility] was unable to disclose exact copay amount and patient can call pharmacy to get exact amount.    Sandie Ano, RN

## 2020-10-27 DIAGNOSIS — M545 Low back pain, unspecified: Secondary | ICD-10-CM | POA: Diagnosis not present

## 2020-10-29 ENCOUNTER — Telehealth: Payer: Self-pay | Admitting: Family Medicine

## 2020-10-29 DIAGNOSIS — E119 Type 2 diabetes mellitus without complications: Secondary | ICD-10-CM

## 2020-10-29 DIAGNOSIS — I10 Essential (primary) hypertension: Secondary | ICD-10-CM

## 2020-10-29 NOTE — Telephone Encounter (Signed)
-----   Message from Aquilla Solian, RT sent at 10/13/2020  1:42 PM EDT ----- Regarding: Lab Orders for Friday 4.29.2022 Please place lab orders for Friday 4.29.2022, appt notes state "A1C and renal panel" Thank you, Jones Bales RT(R)

## 2020-10-30 ENCOUNTER — Other Ambulatory Visit (INDEPENDENT_AMBULATORY_CARE_PROVIDER_SITE_OTHER): Payer: BC Managed Care – PPO

## 2020-10-30 ENCOUNTER — Other Ambulatory Visit: Payer: Self-pay

## 2020-10-30 DIAGNOSIS — I10 Essential (primary) hypertension: Secondary | ICD-10-CM

## 2020-10-30 DIAGNOSIS — E119 Type 2 diabetes mellitus without complications: Secondary | ICD-10-CM | POA: Diagnosis not present

## 2020-10-30 DIAGNOSIS — M545 Low back pain, unspecified: Secondary | ICD-10-CM | POA: Diagnosis not present

## 2020-10-30 LAB — BASIC METABOLIC PANEL
BUN: 20 mg/dL (ref 6–23)
CO2: 29 mEq/L (ref 19–32)
Calcium: 9.6 mg/dL (ref 8.4–10.5)
Chloride: 101 mEq/L (ref 96–112)
Creatinine, Ser: 1.06 mg/dL (ref 0.40–1.50)
GFR: 72.86 mL/min (ref >60.00)
Glucose, Bld: 131 mg/dL — ABNORMAL HIGH (ref 70–99)
Potassium: 4.1 mEq/L (ref 3.5–5.1)
Sodium: 137 mEq/L (ref 135–145)

## 2020-10-30 LAB — HEMOGLOBIN A1C: Hgb A1c MFr Bld: 6.6 % — ABNORMAL HIGH (ref 4.6–6.5)

## 2020-11-05 DIAGNOSIS — M5106 Intervertebral disc disorders with myelopathy, lumbar region: Secondary | ICD-10-CM | POA: Diagnosis not present

## 2021-01-01 ENCOUNTER — Other Ambulatory Visit: Payer: Self-pay | Admitting: Urology

## 2021-01-07 ENCOUNTER — Telehealth: Payer: Self-pay

## 2021-01-07 NOTE — Telephone Encounter (Signed)
Patient called, states [facility] is trying to charge him a copay of over $1000 for his Biktarvy. Says he normally gets copay assistance. Will route to pharmacy team.   RN called [facility], Phoebe Sharps states that copay assistance was applied but that patient's copay is still $926.17 out of pocket.   Sandie Ano, RN

## 2021-01-08 ENCOUNTER — Telehealth: Payer: Self-pay

## 2021-01-08 NOTE — Telephone Encounter (Signed)
Patient complaining we sent a 30 day RX instead of 90 days for Biktarvy. Explained to patient that we wrote for 90 days with 3 refills in April 2022. I called [facility] and spoke to Darl Pikes who reports that on 12/30/20 medication was refilled the last refill sent for 30 days and it is too early to get more refills. I explained to the patient that insurance reported back in April that because the copay is so high ($3,000.00) they refuse to fill 90 day refills now. Patient will contact his insurance to dispute this.

## 2021-01-11 ENCOUNTER — Other Ambulatory Visit (HOSPITAL_COMMUNITY): Payer: Self-pay

## 2021-01-11 NOTE — Telephone Encounter (Signed)
Lupita Leash - can you look into this please? Thank you!

## 2021-01-11 NOTE — Telephone Encounter (Signed)
Patient used up the funds on the copay coupon card. I left a message on the patient phone to see if I can get his income information so I can sign him up for PAF.

## 2021-01-13 ENCOUNTER — Telehealth: Payer: Self-pay

## 2021-01-13 NOTE — Telephone Encounter (Signed)
[facility] Patient Advocate Encounter   I was successful in securing patient a $7500 grant from [employer] (PA[employer]) to provide copayment coverage for Biktarvy.  This will make the out of pocket cost $0.00.     I have spoken with the patient.    The billing information is as follows and has been shared with [facility].     01/05/21 through 01/06/22    Patient knows to call the office with questions or concerns.  Clearance Coots, CPhT Specialty Pharmacy Patient [facility] Phone: [phone] Fax:  [phone]

## 2021-01-26 LAB — HM DIABETES EYE EXAM

## 2021-02-08 ENCOUNTER — Other Ambulatory Visit: Payer: Self-pay

## 2021-02-08 ENCOUNTER — Ambulatory Visit (INDEPENDENT_AMBULATORY_CARE_PROVIDER_SITE_OTHER): Payer: BC Managed Care – PPO | Admitting: Family Medicine

## 2021-02-08 VITALS — BP 140/70 | HR 69 | Temp 97.5°F | Ht 65.5 in | Wt 176.4 lb

## 2021-02-08 DIAGNOSIS — E1169 Type 2 diabetes mellitus with other specified complication: Secondary | ICD-10-CM

## 2021-02-08 DIAGNOSIS — H6123 Impacted cerumen, bilateral: Secondary | ICD-10-CM

## 2021-02-08 DIAGNOSIS — I10 Essential (primary) hypertension: Secondary | ICD-10-CM | POA: Diagnosis not present

## 2021-02-08 DIAGNOSIS — E119 Type 2 diabetes mellitus without complications: Secondary | ICD-10-CM | POA: Diagnosis not present

## 2021-02-08 DIAGNOSIS — H6120 Impacted cerumen, unspecified ear: Secondary | ICD-10-CM | POA: Insufficient documentation

## 2021-02-08 DIAGNOSIS — E785 Hyperlipidemia, unspecified: Secondary | ICD-10-CM | POA: Diagnosis not present

## 2021-02-08 LAB — POCT GLYCOSYLATED HEMOGLOBIN (HGB A1C): Hemoglobin A1C: 7.3 % — AB (ref 4.0–5.6)

## 2021-02-08 MED ORDER — ROSUVASTATIN CALCIUM 20 MG PO TABS: 20.0000 mg | ORAL_TABLET | Freq: Every day | ORAL | 3 refills | Status: DC

## 2021-02-08 NOTE — Patient Instructions (Addendum)
Get back to how you were eating in April  Cut back a bit on the starches like you were then  Follow up in 3 months with labs prior    If any ear issues, let us know

## 2021-02-08 NOTE — Assessment & Plan Note (Addendum)
bp in fair control at this time  BP Readings from Last 1 Encounters:  02/08/21 (!) 156/76  bp is lower at home  Did NOT take his bp medicine today No changes needed Most recent labs reviewed  Disc lifstyle change with low sodium diet and exercise

## 2021-02-08 NOTE — Assessment & Plan Note (Signed)
LDL is controlled  Adv more exercise for HDL if possible Disc goals for lipids and reasons to control them Rev last labs with pt Rev low sat fat diet in detail

## 2021-02-08 NOTE — Progress Notes (Signed)
Subjective:    Patient ID: Clinton Watts, male    DOB: Jun 01, 1956, 65 y.o.   MRN: [id number]  This visit occurred during the SARS-CoV-2 public health emergency.  Safety protocols were in place, including screening questions prior to the visit, additional usage of staff PPE, and extensive cleaning of exam room while observing appropriate contact time as indicated for disinfecting solutions.   HPI Pt presents for f/u of DM2 and HTN Also ear fullness from cerumen impaction   Wt Readings from Last 3 Encounters:  02/08/21 176 lb 7 oz (80 kg)  10/09/20 170 lb (77.1 kg)  10/08/20 169 lb (76.7 kg)   28.91 kg/m    DM2 Lab Results  Component Value Date   HGBA1C 6.6 (H) 10/30/2020   This was much improved/down from 11 the time before  Better diet/exercise  Glipizide xl 10 mg daily  Metformin xr 1000 mg daily   Everything he eats makes glucose go up  Salmon and veg and sweet potato  Veggie burgers   This am fasting 168  (higher in am)  Last night 123   Lab Results  Component Value Date   HGBA1C 7.3 (A) 02/08/2021   Was on vacation - ate more starch /rice    Exercise- some but needs more - likes to walk/hike in the park  Work is physical/ walking    On arb and statin    HTN bp is stable today  No cp or palpitations or headaches or edema  No side effects to medicines  BP Readings from Last 3 Encounters:  02/08/21 140/70  10/09/20 (!) 150/70  10/08/20 (!) 146/84    Bp was up this am  Generally at home 130s/60s to 70s - generally lower than here  Has not taken medicine yet today so bp is up (he takes it at 9 am)    Takes losartan 100 mg daily (last visit inc this from 50 to 100)  Pulse Readings from Last 3 Encounters:  02/08/21 69  10/09/20 76  10/08/20 66     Hyperlipidemia  Lab Results  Component Value Date   CHOL 136 07/14/2020   HDL 30.70 (L) 07/14/2020   LDLCALC 51 08/12/2019   LDLDIRECT 67.0 07/14/2020   TRIG 300.0 (H) 07/14/2020   CHOLHDL 4  07/14/2020   Crestor 20 mg daily  LDL of 51 at goal  Needs refill   Patient Active Problem List   Diagnosis Date Noted   Cerumen impaction 02/08/2021   Therapeutic drug monitoring 10/09/2020   Erectile dysfunction due to arterial insufficiency 07/30/2019   Hypogonadism in male 07/13/2019   Essential hypertension 01/22/2018   Human immunodeficiency virus (HIV) disease (HCC) 10/12/2017   Healthcare maintenance 10/12/2017   Erectile dysfunction 10/12/2017   History of hernia repair 08/26/2017   Type 2 diabetes mellitus without complication, without long-term current use of insulin (HCC) 08/26/2017   Hyperlipidemia associated with type 2 diabetes mellitus (HCC) 08/26/2017   Past Medical History:  Diagnosis Date   Diabetes mellitus without complication (HCC)    ED (erectile dysfunction)    HIV (human immunodeficiency virus infection) (HCC) 10/12/2017   Hyperlipidemia    Past Surgical History:  Procedure Laterality Date   HERNIA REPAIR     Social History   Tobacco Use   Smoking status: Never   Smokeless tobacco: Never  Vaping Use   Vaping Use: Never used  Substance Use Topics   Alcohol use: No   Drug use: No   Family History  Problem Relation Age of Onset   Diabetes Mother    Stomach cancer Sister    Pancreatic cancer Brother    Prostate cancer Neg Hx    Bladder Cancer Neg Hx    Kidney cancer Neg Hx    No Known Allergies Current Outpatient Medications on File Prior to Visit  Medication Sig Dispense Refill   bictegravir-emtricitabine-tenofovir AF (BIKTARVY) 50-200-25 MG TABS tablet Take 1 tablet by mouth daily. 90 tablet 3   glipiZIDE (GLUCOTROL XL) 10 MG 24 hr tablet Take 1 tablet (10 mg total) by mouth daily with breakfast. 90 tablet 3   losartan (COZAAR) 100 MG tablet Take 1 tablet (100 mg total) by mouth daily. 90 tablet 3   metFORMIN (GLUCOPHAGE XR) 500 MG 24 hr tablet Take 2 tablets (1,000 mg total) by mouth daily with breakfast. 180 tablet 3   rosuvastatin  (CRESTOR) 20 MG tablet Take 1 tablet (20 mg total) by mouth daily. 90 tablet 3   sildenafil (REVATIO) 20 MG tablet TAKE 2 TO 5 TABLETS ONE HOUR PRIOR TO INTERCOURSE 60 tablet 0   tadalafil (CIALIS) 20 MG tablet 1 tablet 1 hour prior to intercourse 60 tablet 2   No current facility-administered medications on file prior to visit.     Review of Systems     Objective:   Physical Exam Constitutional:      General: He is not in acute distress.    Appearance: Normal appearance. He is well-developed and normal weight. He is not ill-appearing or diaphoretic.  HENT:     Head: Normocephalic and atraumatic.     Right Ear: There is impacted cerumen.     Left Ear: There is impacted cerumen.     Ears:     Comments: Mild wet cerumen impaction bilaterally After consent obtained, simple ear irrigation performed  Pt tolerated well  Able to clearly see TMs after procedure  Hearing improved also Eyes:     Conjunctiva/sclera: Conjunctivae normal.     Pupils: Pupils are equal, round, and reactive to light.  Neck:     Thyroid: No thyromegaly.     Vascular: No carotid bruit or JVD.  Cardiovascular:     Rate and Rhythm: Normal rate and regular rhythm.     Heart sounds: Normal heart sounds.    No gallop.  Pulmonary:     Effort: Pulmonary effort is normal. No respiratory distress.     Breath sounds: Normal breath sounds. No wheezing or rales.  Abdominal:     General: Bowel sounds are normal. There is no distension or abdominal bruit.     Palpations: Abdomen is soft. There is no mass.     Tenderness: There is no abdominal tenderness.  Musculoskeletal:     Cervical back: Normal range of motion and neck supple.     Right lower leg: No edema.     Left lower leg: No edema.  Lymphadenopathy:     Cervical: No cervical adenopathy.  Skin:    General: Skin is warm and dry.     Coloration: Skin is not pale.     Findings: No rash.  Neurological:     Mental Status: He is alert.     Coordination:  Coordination normal.     Deep Tendon Reflexes: Reflexes are normal and symmetric. Reflexes normal.  Psychiatric:        Mood and Affect: Mood normal.          Assessment & Plan:   Problem List Items Addressed This  Visit       Cardiovascular and Mediastinum   Essential hypertension    bp in fair control at this time  BP Readings from Last 1 Encounters:  02/08/21 (!) 156/76  bp is lower at home  Did NOT take his bp medicine today No changes needed Most recent labs reviewed  Disc lifstyle change with low sodium diet and exercise        Relevant Medications   rosuvastatin (CRESTOR) 20 MG tablet     Endocrine   Type 2 diabetes mellitus without complication, without long-term current use of insulin (HCC) - Primary    Lab Results  Component Value Date   HGBA1C 7.3 (A) 02/08/2021  This is up slt from 6.6 due to vacation/eating starches like rice while out of the country  Plans to get back to the diet/exercise plan he had in April  Feeling good Plan to continue glipizide xl 10 mg daily  Metformin xr 1000 mg daily  No hypoglycemia Sent for recent eye exam-no retinopathy On arb and statin       Relevant Medications   rosuvastatin (CRESTOR) 20 MG tablet   Other Relevant Orders   POCT glycosylated hemoglobin (Hb A1C) (Completed)   Hyperlipidemia associated with type 2 diabetes mellitus (HCC)    LDL is controlled  Adv more exercise for HDL if possible Disc goals for lipids and reasons to control them Rev last labs with pt Rev low sat fat diet in detail        Relevant Medications   rosuvastatin (CRESTOR) 20 MG tablet     Nervous and Auditory   Cerumen impaction    Simple ear irrigation today -resolved  Better hearing  inst to call if ear pain or any problems  Handout given

## 2021-02-08 NOTE — Assessment & Plan Note (Signed)
Lab Results  Component Value Date   HGBA1C 7.3 (A) 02/08/2021   This is up slt from 6.6 due to vacation/eating starches like rice while [place] of the country  Plans to get back to the diet/exercise plan he had in April  Feeling good Plan to continue glipizide xl 10 mg daily  Metformin xr 1000 mg daily  No hypoglycemia Sent for recent eye exam-no retinopathy On arb and statin

## 2021-02-08 NOTE — Assessment & Plan Note (Addendum)
Simple ear irrigation today -resolved  Better hearing  inst to call if ear pain or any problems  Handout given

## 2021-02-17 ENCOUNTER — Encounter: Payer: Self-pay | Admitting: Optometry

## 2021-05-04 ENCOUNTER — Other Ambulatory Visit: Payer: BC Managed Care – PPO

## 2021-05-11 ENCOUNTER — Ambulatory Visit: Payer: BC Managed Care – PPO | Admitting: Family Medicine

## 2021-06-15 ENCOUNTER — Other Ambulatory Visit: Payer: Self-pay | Admitting: Urology

## 2021-07-27 ENCOUNTER — Other Ambulatory Visit (HOSPITAL_COMMUNITY): Payer: Self-pay

## 2021-07-30 ENCOUNTER — Ambulatory Visit: Payer: BC Managed Care – PPO | Admitting: Family Medicine

## 2021-08-01 ENCOUNTER — Other Ambulatory Visit: Payer: Self-pay | Admitting: Family Medicine

## 2021-08-09 ENCOUNTER — Other Ambulatory Visit: Payer: Self-pay | Admitting: Family Medicine

## 2021-08-09 ENCOUNTER — Ambulatory Visit: Payer: BC Managed Care – PPO | Admitting: Infectious Diseases

## 2021-08-09 ENCOUNTER — Other Ambulatory Visit: Payer: Self-pay

## 2021-08-10 NOTE — Telephone Encounter (Signed)
Pt is overdue for his DM f/u, please schedule appt then route back to me to refill med

## 2021-08-10 NOTE — Telephone Encounter (Signed)
Pt has cancelled multiple DM f/u appts and no future appt, last OV was on 02/08/21 but per PCP pt was to f/u in 3 months which he has cancelled 3 f/u appt with PCP and no showed his f/u lab appt

## 2021-08-11 ENCOUNTER — Ambulatory Visit: Payer: Self-pay | Admitting: Urology

## 2021-08-13 ENCOUNTER — Other Ambulatory Visit: Payer: Self-pay | Admitting: Pharmacist

## 2021-08-13 ENCOUNTER — Encounter: Payer: Self-pay | Admitting: Infectious Diseases

## 2021-08-13 ENCOUNTER — Ambulatory Visit (INDEPENDENT_AMBULATORY_CARE_PROVIDER_SITE_OTHER): Payer: BC Managed Care – PPO | Admitting: Infectious Diseases

## 2021-08-13 ENCOUNTER — Other Ambulatory Visit: Payer: Self-pay

## 2021-08-13 DIAGNOSIS — B20 Human immunodeficiency virus [HIV] disease: Secondary | ICD-10-CM

## 2021-08-13 DIAGNOSIS — I10 Essential (primary) hypertension: Secondary | ICD-10-CM

## 2021-08-13 MED ORDER — BIKTARVY 50-200-25 MG PO TABS: 1.0000 | ORAL_TABLET | Freq: Every day | ORAL | 11 refills | Status: DC

## 2021-08-13 MED ORDER — BICTEGRAVIR-EMTRICITAB-TENOFOV 50-200-25 MG PO TABS: 1.0000 | ORAL_TABLET | Freq: Every day | ORAL | 0 refills | Status: AC

## 2021-08-13 NOTE — Progress Notes (Signed)
Name: Clinton Watts  [id number]   DOB: March 31, 1956   PCP: Abner Greenspan, MD    Subjective   Clinton Watts is a 66 y.o. male HIV disease Dx 47. Originally from [place].  HIV Risk: heterosexual contact. Previously in care with [facility] in [place] [phone]).  Hx OIs: none known     Previous Regimens: Combivir + Crixivan Atripla --> well controlled and undetectable for years, switched for TAF regimen Biktarvy 10/2017 - suppressed   Genotypes: None on file or from previous provider    HPI:  Clinton Watts is here for follow up.  Doing well and no complaints today.   Continues on [place] and never misses a dose. He had to reapply for copay assistance in July last year which caused him stress.   Had his 2 shingles vaccines with completion of series in July prior to his trip back to [place]. He feels that he may have picked up some weight but overall pleased with his health.  BP at home is usually 125/62. Forgot to take medication today as he was running out of the house to get to appt today. Has a follow up with PCP to check in for diabetes and other conditions    Medical/surgical/social/family history have been updated during today's visit.     Review of Systems  Constitutional:  Negative for appetite change, chills, fatigue, fever and unexpected weight change.  Eyes:  Negative for visual disturbance.  Respiratory:  Negative for cough and shortness of breath.   Cardiovascular:  Negative for chest pain and leg swelling.  Gastrointestinal:  Negative for abdominal pain, diarrhea and nausea.  Genitourinary:  Negative for dysuria, genital sores and penile discharge.  Musculoskeletal:  Negative for joint swelling.  Skin:  Negative for color change and rash.  Neurological:  Negative for dizziness and headaches.  Hematological:  Negative for adenopathy.  Psychiatric/Behavioral:  Negative for sleep disturbance. The patient is not nervous/anxious.     Objective    Vitals:    08/13/21 0932  BP: (!) 171/73  Pulse: 75    Estimated body mass index is 29.01 kg/m as calculated from the following:   Height as of 02/08/21: 5' 5.5" (1.664 m).   Weight as of this encounter: 177 lb (80.3 kg).      Physical Exam Vitals and nursing note reviewed.  Constitutional:      Appearance: He is well-developed.     Comments: Seated comfortably in chair during visit.   HENT:     Mouth/Throat:     Dentition: Normal dentition. No dental abscesses.  Cardiovascular:     Rate and Rhythm: Normal rate and regular rhythm.     Heart sounds: Normal heart sounds.  Pulmonary:     Effort: Pulmonary effort is normal.     Breath sounds: Normal breath sounds.  Abdominal:     General: There is no distension.     Palpations: Abdomen is soft.     Tenderness: There is no abdominal tenderness.  Lymphadenopathy:     Cervical: No cervical adenopathy.  Skin:    General: Skin is warm and dry.     Findings: No rash.  Neurological:     Mental Status: He is alert and oriented to person, place, and time.  Psychiatric:        Judgment: Judgment normal.     Comments: In good spirits today and engaged in care discussion.     LABS:  HIV 1 RNA Quant  Date Value  09/23/2020 <20 Copies/mL (H)  08/12/2019 <20 NOT DETECTED copies/mL  09/19/2018 <20 DETECTED copies/mL (A)   CD4 T Cell Abs (/uL)  Date Value  09/23/2020 404  08/12/2019 502  09/19/2018 500    Lab Results  Component Value Date   CREATININE 1.06 10/30/2020   CREATININE 1.10 09/23/2020   CREATININE 1.09 07/14/2020    Lab Results  Component Value Date   WBC 6.6 07/14/2020   HGB 13.4 07/14/2020   HCT 41.4 07/14/2020   MCV 65.8 Repeated and verified X2. (L) 07/14/2020   PLT 226.0 07/14/2020    Lab Results  Component Value Date   ALT 13 09/23/2020   AST 21 09/23/2020   ALKPHOS 101 07/14/2020   BILITOT 0.6 09/23/2020     Assessment and Plan: Problem List Items Addressed This Visit       Unprioritized    Human immunodeficiency virus (HIV) disease ([place])    Doing well on once daily Biktarvy. Will update pertinent labs today and have him back for check in in the fall.  I gave him 1 week sample of biktarvy to have a little buffer with his medication as his copay assistance will need to be re-applied for in July after it expires. He will contact Clinton Watts for assistance.  Vaccines up to date including Shingrix.   Refills sent in to his mail order pharmacy.       Relevant Medications   bictegravir-emtricitabine-tenofovir AF (BIKTARVY) 50-200-25 MG TABS tablet   Other Relevant Orders   HIV-1 RNA quant-no reflex-bld   T-helper cells (CD4) count (not at [facility])   Essential hypertension    Has follow up soon with his PCP - BP above target today but did not take dose and stressed getting here. Sounds like BPs at home are < 130/90. He will follow up with his PCP at next scheduled office visit.        Janene Madeira, MSN, NP-C [facility] [email or web address] Pager: [phone] Office: [phone] [place]: [phone]

## 2021-08-13 NOTE — Assessment & Plan Note (Signed)
Doing well on once daily Biktarvy. Will update pertinent labs today and have him back for check in in the fall.  I gave him 1 week sample of biktarvy to have a little buffer with his medication as his copay assistance will need to be re-applied for in July after it expires. He will contact Lupita Leash for assistance.  Vaccines up to date including Shingrix.   Refills sent in to his mail order pharmacy.

## 2021-08-13 NOTE — Assessment & Plan Note (Signed)
Has follow up soon with his PCP - BP above target today but did not take dose and stressed getting here. Sounds like BPs at home are < 130/90. He will follow up with his PCP at next scheduled office visit.

## 2021-08-13 NOTE — Patient Instructions (Addendum)
Always a pleasure to see you!  Anytime you have trouble with getting your medication please call us or send me a mychart message so we can look into copay assistance.   If your blood pressures at home are over 130/90 please let Dr. Milinda Antis know.   It looks like the grant that helps to pay for your copay may expire in July - be prepared we may need to help you renew the assistance.   Would love to see you in the fall prior to the holidays  - we can do labs prior to this visit or the same day - whichever you prefer, schedule up front

## 2021-08-13 NOTE — Progress Notes (Signed)
Medication Samples have been provided to the patient.  Drug name: Biktarvy        Strength: 50/200/25 mg       Qty: 7 tablets (1 bottles) LOT: [id number]   Exp.Date: 10/24  Dosing instructions: Take one tablet by mouth once daily  The patient has been instructed regarding the correct time, dose, and frequency of taking this medication, including desired effects and most common side effects.   Shaylynne Lunt, PharmD, CPP Clinical Pharmacist Practitioner Infectious Diseases Clinical Pharmacist [facility]  

## 2021-08-16 ENCOUNTER — Other Ambulatory Visit: Payer: Self-pay | Admitting: Urology

## 2021-08-16 LAB — HIV-1 RNA QUANT-NO REFLEX-BLD
HIV 1 RNA Quant: NOT DETECTED Copies/mL
HIV-1 RNA Quant, Log: NOT DETECTED Log cps/mL

## 2021-08-16 LAB — T-HELPER CELLS (CD4) COUNT (NOT AT ARMC)
Absolute CD4: 575 cells/uL (ref 490–1740)
CD4 T Helper %: 32 % (ref 30–61)
Total lymphocyte count: 1822 cells/uL (ref 850–3900)

## 2021-08-17 ENCOUNTER — Other Ambulatory Visit: Payer: Self-pay | Admitting: *Deleted

## 2021-08-17 ENCOUNTER — Telehealth: Payer: Self-pay | Admitting: Urology

## 2021-08-17 MED ORDER — TADALAFIL 20 MG PO TABS: ORAL_TABLET | ORAL | 0 refills | Status: DC

## 2021-08-17 NOTE — Telephone Encounter (Signed)
Sent in 10 cialis until his appt with Dr. Lonna Cobb

## 2021-08-17 NOTE — Telephone Encounter (Signed)
Pt is out of meds (Cialis) and called office to schedule appt, lab appt for PSA 2/20 and appt w/[facility] on 3/2.  He wants to know if he can get a couple weeks of meds to last until appt sent to [facility].

## 2021-08-23 ENCOUNTER — Other Ambulatory Visit: Payer: Self-pay

## 2021-08-23 ENCOUNTER — Other Ambulatory Visit: Payer: BC Managed Care – PPO

## 2021-08-23 DIAGNOSIS — Z125 Encounter for screening for malignant neoplasm of prostate: Secondary | ICD-10-CM

## 2021-08-24 LAB — PSA: Prostate Specific Ag, Serum: 1.1 ng/mL (ref 0.0–4.0)

## 2021-08-30 ENCOUNTER — Ambulatory Visit: Payer: BC Managed Care – PPO | Admitting: Family Medicine

## 2021-09-02 ENCOUNTER — Other Ambulatory Visit: Payer: Self-pay

## 2021-09-02 ENCOUNTER — Ambulatory Visit (INDEPENDENT_AMBULATORY_CARE_PROVIDER_SITE_OTHER): Payer: BC Managed Care – PPO | Admitting: Urology

## 2021-09-02 ENCOUNTER — Encounter: Payer: Self-pay | Admitting: Urology

## 2021-09-02 VITALS — BP 150/72 | HR 73 | Ht 68.0 in | Wt 170.0 lb

## 2021-09-02 DIAGNOSIS — N5201 Erectile dysfunction due to arterial insufficiency: Secondary | ICD-10-CM

## 2021-09-02 DIAGNOSIS — Z125 Encounter for screening for malignant neoplasm of prostate: Secondary | ICD-10-CM

## 2021-09-02 MED ORDER — SILDENAFIL CITRATE 20 MG PO TABS: ORAL_TABLET | ORAL | 3 refills | Status: DC

## 2021-09-03 ENCOUNTER — Encounter: Payer: Self-pay | Admitting: Urology

## 2021-09-03 NOTE — Progress Notes (Signed)
? ?  09/02/2021 ?7:16 AM  ? ?Frazier Richards ?31-Jan-1956 ?[id number] ? ?Referring provider: Judy Pimple, MD ?[address] [place] ?[place],  [place] [address] ? ?Chief Complaint  ?Patient presents with  ? Elevated PSA  ? ?Urologic history: ?1.  Erectile dysfunction ?PDE 5 inhibitor therapy 6 ? ? ?HPI: ?66 y.o. male presents for annual follow-up. ? ?Previously on sildenafil and was switched to tadalafil 2 years ago however today he states he feels the sildenafil is more effective and would like to switch back ?No bothersome LUTS ?Denies dysuria, gross hematuria ?PSA 08/23/2021 stable 1.1 ?History hypogonadism but does not desire TRT ? ? ?PMH: ?Past Medical History:  ?Diagnosis Date  ? Diabetes mellitus without complication (HCC)   ? ED (erectile dysfunction)   ? HIV (human immunodeficiency virus infection) (HCC) 10/12/2017  ? Hyperlipidemia   ? ? ?Surgical History: ?Past Surgical History:  ?Procedure Laterality Date  ? HERNIA REPAIR    ? ? ?Home Medications:  ?Allergies as of 09/02/2021   ?No Known Allergies ?  ? ?  ?Medication List  ?  ? ?  ? Accurate as of September 02, 2021 11:59 PM. If you have any questions, ask your nurse or doctor.  ?  ?  ? ?  ? ?Biktarvy 50-200-25 MG Tabs tablet ?Generic drug: bictegravir-emtricitabine-tenofovir AF ?Take 1 tablet by mouth daily. ?  ?glipiZIDE 10 MG 24 hr tablet ?Commonly known as: GLUCOTROL XL ?TAKE 1 TABLET DAILY WITH   BREAKFAST ?  ?losartan 100 MG tablet ?Commonly known as: COZAAR ?TAKE 1 TABLET DAILY ?  ?metFORMIN 500 MG 24 hr tablet ?Commonly known as: Glucophage XR ?Take 2 tablets (1,000 mg total) by mouth daily with breakfast. ?  ?rosuvastatin 20 MG tablet ?Commonly known as: CRESTOR ?Take 1 tablet (20 mg total) by mouth daily. ?  ?sildenafil 20 MG tablet ?Commonly known as: REVATIO ?TAKE 2 TO 5 TABLETS ONE HOUR PRIOR TO INTERCOURSE ?  ?tadalafil 20 MG tablet ?Commonly known as: CIALIS ?1 tablet 1 hour prior to intercourse ?  ? ?  ? ? ?Allergies: No Known Allergies ? ?Family  History: ?Family History  ?Problem Relation Age of Onset  ? Diabetes Mother   ? Stomach cancer Sister   ? Pancreatic cancer Brother   ? Prostate cancer Neg Hx   ? Bladder Cancer Neg Hx   ? Kidney cancer Neg Hx   ? ? ?Social History:  reports that he has never smoked. He has never used smokeless tobacco. He reports that he does not drink alcohol and does not use drugs. ? ? ?Physical Exam: ?BP (!) 150/72   Pulse 73   Ht 5\' 8"  (1.727 m)   Wt 170 lb (77.1 kg)   BMI 25.85 kg/m?   ?Constitutional:  Alert and oriented, No acute distress. ?HEENT: [place] AT, moist mucus membranes.  Trachea midline, no masses. ?Cardiovascular: No clubbing, cyanosis, or edema. ?Respiratory: Normal respiratory effort, no increased work of breathing. ?GU: Declined DRE today ?Psychiatric: Normal mood and affect. ? ? ? ?Assessment & Plan:   ? ?1.  Erectile dysfunction ?Stable ?Rx sildenafil sent to pharmacy ? ?2.  Prostate cancer screening ?PSA remains low and stable ? ?Continue annual follow-up ? ? ? , MD ? ?[facility] ?[address], [address] ?[place], [place] ?[phone] ? ?

## 2021-09-10 ENCOUNTER — Encounter: Payer: Self-pay | Admitting: Family Medicine

## 2021-09-10 ENCOUNTER — Other Ambulatory Visit: Payer: Self-pay

## 2021-09-10 ENCOUNTER — Ambulatory Visit (INDEPENDENT_AMBULATORY_CARE_PROVIDER_SITE_OTHER): Payer: BC Managed Care – PPO | Admitting: Family Medicine

## 2021-09-10 VITALS — BP 138/72 | HR 61 | Temp 97.9°F | Ht 68.0 in | Wt 177.0 lb

## 2021-09-10 DIAGNOSIS — E1169 Type 2 diabetes mellitus with other specified complication: Secondary | ICD-10-CM | POA: Diagnosis not present

## 2021-09-10 DIAGNOSIS — E119 Type 2 diabetes mellitus without complications: Secondary | ICD-10-CM

## 2021-09-10 DIAGNOSIS — I10 Essential (primary) hypertension: Secondary | ICD-10-CM

## 2021-09-10 DIAGNOSIS — E785 Hyperlipidemia, unspecified: Secondary | ICD-10-CM | POA: Diagnosis not present

## 2021-09-10 LAB — POCT GLYCOSYLATED HEMOGLOBIN (HGB A1C): Hemoglobin A1C: 7.2 % — AB (ref 4.0–5.6)

## 2021-09-10 MED ORDER — LOSARTAN POTASSIUM 100 MG PO TABS: 100.0000 mg | ORAL_TABLET | Freq: Every day | ORAL | 3 refills | Status: DC

## 2021-09-10 MED ORDER — ROSUVASTATIN CALCIUM 20 MG PO TABS: 20.0000 mg | ORAL_TABLET | Freq: Every day | ORAL | 3 refills | Status: DC

## 2021-09-10 MED ORDER — GLIPIZIDE ER 10 MG PO TB24: 10.0000 mg | ORAL_TABLET | Freq: Every day | ORAL | 3 refills | Status: DC

## 2021-09-10 MED ORDER — METFORMIN HCL ER 500 MG PO TB24: 1000.0000 mg | ORAL_TABLET | Freq: Every day | ORAL | 3 refills | Status: DC

## 2021-09-10 NOTE — Assessment & Plan Note (Signed)
Lab Results  ?Component Value Date  ? HGBA1C 7.2 (A) 09/10/2021  ? ?Stable  ?Down from 7.3 ?Still working on habits but pleased  ?Plan to continue metformin xr 1000 mg daily  ?Glipizide xl 10 mg daily  ?Eye exam utd ?Nl foot exam  ?F/u 6 mo  ?

## 2021-09-10 NOTE — Progress Notes (Signed)
? ?Subjective:  ? ? Patient ID: Clinton Watts, male    DOB: 09-12-1955, 66 y.o.   MRN: [id number] ? ?This visit occurred during the SARS-CoV-2 public health emergency.  Safety protocols were in place, including screening questions prior to the visit, additional usage of staff PPE, and extensive cleaning of exam room while observing appropriate contact time as indicated for disinfecting solutions.  ? ?HPI ?Pt presents for f/u of DM2  ? ? ?Wt Readings from Last 3 Encounters:  ?09/10/21 177 lb (80.3 kg)  ?09/02/21 170 lb (77.1 kg)  ?08/13/21 177 lb (80.3 kg)  ? ?26.91 kg/m? ? ?Working - wants to keep working/not ready to retire  ?Moved also -is happy [place]/moved out of apt into home  ? ?Feeling fine overall  ? ?Does not sleep well  ?Hard to go back to sleep at night  ? ? ?HTN ?bp is stable today  ?No cp or palpitations or headaches or edema  ?No side effects to medicines  ?BP Readings from Last 3 Encounters:  ?09/10/21 138/72  ?09/02/21 (!) 150/72  ?08/13/21 (!) 171/73  ?  Losartan 100 mg daily  ? ? ?DM2 ?Lab Results  ?Component Value Date  ? HGBA1C 7.2 (A) 09/10/2021  ? ? ?This is down from 7.3 ?Glipizide xl 10 mg daily  ?Metformin xr 1000 mg daily  ? ?Exercise -walking regularly and lifting  ?Very physical job  ? ?Eats healthy  ?Does it with wife  ? ?Eating fish  ?Veggies ?No fried food  ?Sweet potato  ? ?No soda  ?Has some 0 water and regular water  ?Avoids sweets  ? ?[facility]  ?Had shingrix also  ? ?Eye exam  01/2021  ? ?On arb and statin  ? ?Lab Results  ?Component Value Date  ? CHOL 136 07/14/2020  ? HDL 30.70 (L) 07/14/2020  ? LDLCALC 51 08/12/2019  ? LDLDIRECT 67.0 07/14/2020  ? TRIG 300.0 (H) 07/14/2020  ? CHOLHDL 4 07/14/2020  ? ? ?Patient Active Problem List  ? Diagnosis Date Noted  ? Cerumen impaction 02/08/2021  ? Therapeutic drug monitoring 10/09/2020  ? Erectile dysfunction due to arterial insufficiency 07/30/2019  ? Hypogonadism in male 07/13/2019  ? Essential hypertension 01/22/2018  ?  Human immunodeficiency virus (HIV) disease (HCC) 10/12/2017  ? Healthcare maintenance 10/12/2017  ? Erectile dysfunction 10/12/2017  ? History of hernia repair 08/26/2017  ? Type 2 diabetes mellitus without complication, without long-term current use of insulin (HCC) 08/26/2017  ? Hyperlipidemia associated with type 2 diabetes mellitus (HCC) 08/26/2017  ? ?Past Medical History:  ?Diagnosis Date  ? Diabetes mellitus without complication (HCC)   ? ED (erectile dysfunction)   ? HIV (human immunodeficiency virus infection) (HCC) 10/12/2017  ? Hyperlipidemia   ? ?Past Surgical History:  ?Procedure Laterality Date  ? HERNIA REPAIR    ? ?Social History  ? ?Tobacco Use  ? Smoking status: Never  ? Smokeless tobacco: Never  ?Vaping Use  ? Vaping Use: Never used  ?Substance Use Topics  ? Alcohol use: No  ? Drug use: No  ? ?Family History  ?Problem Relation Age of Onset  ? Diabetes Mother   ? Stomach cancer Sister   ? Pancreatic cancer Brother   ? Prostate cancer Neg Hx   ? Bladder Cancer Neg Hx   ? Kidney cancer Neg Hx   ? ?No Known Allergies ?Current Outpatient Medications on File Prior to Visit  ?Medication Sig Dispense Refill  ? bictegravir-emtricitabine-tenofovir  AF (BIKTARVY) 50-200-25 MG TABS tablet Take 1 tablet by mouth daily. 30 tablet 11  ? sildenafil (REVATIO) 20 MG tablet TAKE 2 TO 5 TABLETS ONE HOUR PRIOR TO INTERCOURSE 90 tablet 3  ? tadalafil (CIALIS) 20 MG tablet 1 tablet 1 hour prior to intercourse 10 tablet 0  ? ?No current facility-administered medications on file prior to visit.  ?  ?Review of Systems  ?Constitutional:  Negative for activity change, appetite change, fatigue, fever and unexpected weight change.  ?HENT:  Negative for congestion, rhinorrhea, sore throat and trouble swallowing.   ?Eyes:  Negative for pain, redness, itching and visual disturbance.  ?Respiratory:  Negative for cough, chest tightness, shortness of breath and wheezing.   ?Cardiovascular:  Negative for chest pain and palpitations.   ?Gastrointestinal:  Negative for abdominal pain, blood in stool, constipation, diarrhea and nausea.  ?Endocrine: Negative for cold intolerance, heat intolerance, polydipsia and polyuria.  ?Genitourinary:  Negative for difficulty urinating, dysuria, frequency and urgency.  ?Musculoskeletal:  Negative for arthralgias, joint swelling and myalgias.  ?Skin:  Negative for pallor and rash.  ?Neurological:  Negative for dizziness, tremors, weakness, numbness and headaches.  ?Hematological:  Negative for adenopathy. Does not bruise/bleed easily.  ?Psychiatric/Behavioral:  Negative for decreased concentration and dysphoric mood. The patient is not nervous/anxious.   ? ?   ?Objective:  ? Physical Exam ?Constitutional:   ?   General: He is not in acute distress. ?   Appearance: Normal appearance. He is well-developed and normal weight.  ?HENT:  ?   Head: Normocephalic and atraumatic.  ?Eyes:  ?   General: No scleral icterus. ?   Conjunctiva/sclera: Conjunctivae normal.  ?   Pupils: Pupils are equal, round, and reactive to light.  ?Neck:  ?   Thyroid: No thyromegaly.  ?   Vascular: No carotid bruit or JVD.  ?Cardiovascular:  ?   Rate and Rhythm: Normal rate and regular rhythm.  ?   Heart sounds: Normal heart sounds.  ?  No gallop.  ?Pulmonary:  ?   Effort: Pulmonary effort is normal. No respiratory distress.  ?   Breath sounds: Normal breath sounds. No wheezing or rales.  ?Abdominal:  ?   General: Bowel sounds are normal. There is no abdominal bruit.  ?   Palpations: Abdomen is soft. There is no mass.  ?Musculoskeletal:  ?   Cervical back: Normal range of motion and neck supple.  ?   Right lower leg: No edema.  ?   Left lower leg: No edema.  ?Lymphadenopathy:  ?   Cervical: No cervical adenopathy.  ?Skin: ?   General: Skin is warm and dry.  ?   Coloration: Skin is not pale.  ?   Findings: No rash.  ?Neurological:  ?   Mental Status: He is alert.  ?   Sensory: No sensory deficit.  ?   Coordination: Coordination normal.  ?   Deep  Tendon Reflexes: Reflexes are normal and symmetric. Reflexes normal.  ?Psychiatric:     ?   Mood and Affect: Mood normal.  ? ? ? ? ? ?   ?Assessment & Plan:  ? ?Problem List Items Addressed This Visit   ? ?  ? Cardiovascular and Mediastinum  ? Essential hypertension  ?  bp in fair control at this time  ?BP Readings from Last 1 Encounters:  ?09/10/21 138/72  ?No changes needed ?Most recent labs reviewed  ?Disc lifstyle change with low sodium diet and exercise  ?Habits are good ?Plan  to continue losartan 100 mg daily  ? ?  ?  ? Relevant Medications  ? rosuvastatin (CRESTOR) 20 MG tablet  ? losartan (COZAAR) 100 MG tablet  ?  ? Endocrine  ? Hyperlipidemia associated with type 2 diabetes mellitus (HCC)  ?  Disc goals for lipids and reasons to control them ?Rev last labs with pt ?Rev low sat fat diet in detail  ?Plan to continue crestor 20 mg daily  ?Encourage exercise for low HDL and good glucose control for trig  ?  ?  ? Relevant Medications  ? metFORMIN (GLUCOPHAGE XR) 500 MG 24 hr tablet  ? rosuvastatin (CRESTOR) 20 MG tablet  ? glipiZIDE (GLUCOTROL XL) 10 MG 24 hr tablet  ? losartan (COZAAR) 100 MG tablet  ? Type 2 diabetes mellitus without complication, without long-term current use of insulin (HCC) - Primary  ?  Lab Results  ?Component Value Date  ? HGBA1C 7.2 (A) 09/10/2021  ?Stable  ?Down from 7.3 ?Still working on habits but pleased  ?Plan to continue metformin xr 1000 mg daily  ?Glipizide xl 10 mg daily  ?Eye exam utd ?Nl foot exam  ?F/u 6 mo  ?  ?  ? Relevant Medications  ? metFORMIN (GLUCOPHAGE XR) 500 MG 24 hr tablet  ? rosuvastatin (CRESTOR) 20 MG tablet  ? glipiZIDE (GLUCOTROL XL) 10 MG 24 hr tablet  ? losartan (COZAAR) 100 MG tablet  ? Other Relevant Orders  ? HgB A1c (Completed)  ? ? ?

## 2021-09-10 NOTE — Assessment & Plan Note (Signed)
bp in fair control at this time  ?BP Readings from Last 1 Encounters:  ?09/10/21 138/72  ? ?No changes needed ?Most recent labs reviewed  ?Disc lifstyle change with low sodium diet and exercise  ?Habits are good ?Plan to continue losartan 100 mg daily  ? ?

## 2021-09-10 NOTE — Assessment & Plan Note (Signed)
Disc goals for lipids and reasons to control them ?Rev last labs with pt ?Rev low sat fat diet in detail  ?Plan to continue crestor 20 mg daily  ?Encourage exercise for low HDL and good glucose control for trig  ?

## 2021-09-10 NOTE — Patient Instructions (Addendum)
Keep working on diet and exercise  ?A1c is 7.2,   stable  ? ?Try to get most of your carbohydrates from produce (with the exception of white potatoes)  ?Eat less bread/pasta/rice/snack foods/cereals/sweets and other items from the middle of the grocery store (processed carbs) ? ? ?Follow up in early sept for your annual exam  ?

## 2021-09-17 ENCOUNTER — Telehealth: Payer: Self-pay | Admitting: Pharmacist

## 2021-09-17 ENCOUNTER — Other Ambulatory Visit: Payer: Self-pay | Admitting: Pharmacist

## 2021-09-17 ENCOUNTER — Other Ambulatory Visit (HOSPITAL_COMMUNITY): Payer: Self-pay

## 2021-09-17 DIAGNOSIS — B20 Human immunodeficiency virus [HIV] disease: Secondary | ICD-10-CM

## 2021-09-17 MED ORDER — DOVATO 50-300 MG PO TABS: 1.0000 | ORAL_TABLET | Freq: Every day | ORAL | 0 refills | Status: DC

## 2021-09-17 MED ORDER — DOVATO 50-300 MG PO TABS: 1.0000 | ORAL_TABLET | Freq: Every day | ORAL | 1 refills | Status: DC

## 2021-09-17 MED ORDER — DESCOVY 200-25 MG PO TABS: 1.0000 | ORAL_TABLET | Freq: Every day | ORAL | 0 refills | Status: DC

## 2021-09-17 NOTE — Telephone Encounter (Signed)
Patient requires medication change from [place] due to exhausting multiple funds including [employer]ile[employer] and PAF; unable to transition to Tivicay/Descovy due to high copay with Descovy which will not be covered with grants/assistance either due to exhausting current funds. Per discussion with Colletta [place] today, will transition patient to [place]. This is no charge to patient and can be filled as 90 day supplies.  ? ?Will follow-up with patient in ~1 month to assess for adherence and any adverse effects. Patient unsure about work schedule yet, so he will call us next week to schedule a visit. ? ?Explained that Dovato is a one pill once daily medication with or without food and the importance of not missing any doses. Cautioned on possible side effects the first week or so including nausea, diarrhea, dizziness, and headaches but that they should resolve after the first couple of weeks. I reviewed patient medications and found no drug interactions. Counseled patient to separate Dovato from divalent cations including multivitamins.  ? ?Alfonse Spruce, PharmD, CPP ?Clinical Pharmacist Practitioner ?Infectious Diseases Clinical Pharmacist ?[facility] ? ?

## 2021-09-23 ENCOUNTER — Other Ambulatory Visit (HOSPITAL_COMMUNITY): Payer: Self-pay

## 2021-09-23 ENCOUNTER — Other Ambulatory Visit: Payer: Self-pay | Admitting: Pharmacist

## 2021-09-23 ENCOUNTER — Telehealth: Payer: Self-pay

## 2021-09-23 DIAGNOSIS — B20 Human immunodeficiency virus [HIV] disease: Secondary | ICD-10-CM

## 2021-09-23 MED ORDER — DOVATO 50-300 MG PO TABS
1.0000 | ORAL_TABLET | Freq: Every day | ORAL | 0 refills | Status: DC
  Filled 2021-09-23: qty 90, 90d supply, fill #0
  Filled 2021-09-23: qty 30, 30d supply, fill #0
  Filled 2021-10-14: qty 30, 30d supply, fill #1
  Filled 2021-11-08: qty 30, 30d supply, fill #2

## 2021-09-23 NOTE — Telephone Encounter (Signed)
[facility] Patient Advocate Encounter   Was successful in obtaining a Viiv copay card for Dovato.  This copay card will make the patients copay $0.00.  I have spoken with the patient.    The billing information is as follows and has been shared with [facility].         Clinton Watts, CPhT Specialty Pharmacy Patient Advocate [facility] Phone: [phone] Fax:  [phone]  

## 2021-10-07 ENCOUNTER — Other Ambulatory Visit (HOSPITAL_COMMUNITY): Payer: Self-pay

## 2021-10-07 NOTE — Telephone Encounter (Signed)
[facility] called requesting information regarding multiple HIV regimens on file. Reviewed that patient switched from [facility] to [facility] d/t funding issues and that the Acquanetta Belling is now filled through our specialty pharmacy. Discussed that we sent a Descovy script over in order to run test claims but that we changed to Dovato from Tivicay/Descovy given high copay issues. All prescriptions with [facility] put on hold as patient is filling through [facility] at this time. ? ?Margarite Gouge, PharmD, CPP ?Clinical Pharmacist Practitioner ?Infectious Diseases Clinical Pharmacist ?[facility] ? ? ?

## 2021-10-12 ENCOUNTER — Other Ambulatory Visit (HOSPITAL_COMMUNITY): Payer: Self-pay

## 2021-10-14 ENCOUNTER — Other Ambulatory Visit (HOSPITAL_COMMUNITY): Payer: Self-pay

## 2021-10-18 ENCOUNTER — Other Ambulatory Visit (HOSPITAL_COMMUNITY): Payer: Self-pay

## 2021-11-05 ENCOUNTER — Other Ambulatory Visit (HOSPITAL_COMMUNITY): Payer: Self-pay

## 2021-11-08 ENCOUNTER — Other Ambulatory Visit (HOSPITAL_COMMUNITY): Payer: Self-pay

## 2021-11-12 ENCOUNTER — Telehealth: Payer: Self-pay | Admitting: *Deleted

## 2021-11-12 ENCOUNTER — Telehealth: Payer: Self-pay | Admitting: Family Medicine

## 2021-11-12 ENCOUNTER — Ambulatory Visit (INDEPENDENT_AMBULATORY_CARE_PROVIDER_SITE_OTHER): Payer: BC Managed Care – PPO | Admitting: Family Medicine

## 2021-11-12 ENCOUNTER — Other Ambulatory Visit (HOSPITAL_COMMUNITY): Payer: Self-pay

## 2021-11-12 ENCOUNTER — Encounter: Payer: Self-pay | Admitting: Family Medicine

## 2021-11-12 VITALS — BP 150/70 | HR 60 | Temp 97.3°F | Ht 68.0 in | Wt 174.2 lb

## 2021-11-12 DIAGNOSIS — D849 Immunodeficiency, unspecified: Secondary | ICD-10-CM | POA: Insufficient documentation

## 2021-11-12 DIAGNOSIS — M79644 Pain in right finger(s): Secondary | ICD-10-CM | POA: Insufficient documentation

## 2021-11-12 MED ORDER — AMOXICILLIN-POT CLAVULANATE 875-125 MG PO TABS: 1.0000 | ORAL_TABLET | Freq: Two times a day (BID) | ORAL | 0 refills | Status: DC

## 2021-11-12 MED ORDER — CEFTRIAXONE SODIUM 1 G IJ SOLR
1.0000 g | Freq: Once | INTRAMUSCULAR | Status: AC
  Administered 2021-11-12: 1 g via INTRAMUSCULAR

## 2021-11-12 NOTE — Telephone Encounter (Signed)
PLEASE NOTE: All timestamps contained within this report are represented as [place] Standard Time. ?CONFIDENTIALTY NOTICE: This fax transmission is intended only for the addressee. It contains information that is legally privileged, confidential or otherwise protected from ?use or disclosure. If you are not the intended recipient, you are strictly prohibited from reviewing, disclosing, copying using or disseminating any of this information or taking any ?action in reliance on or regarding this information. If you have received this fax in error, please notify us immediately by telephone so that we can arrange for its return to [place]. ?Phone: [phone], Toll-Free: [phone], Fax: [phone] ?Page: 1 of 1 ?Call Id: [id number] ?[facility] Night - Client ?Nonclinical Telephone Record  ?AccessNurse? ?Client [facility] Night - Client ?Client Site  [facility] [place] - Night ?Provider AA - PHYSICIAN, NOT LISTED- MD ?Contact Type Call ?Who Is Calling Patient / Member / Family / Caregiver ?Caller Name Clinton Watts ?Caller Phone Number [phone] ?Patient Name Clinton Watts ?Patient DOB 01-Oct-1955 ?Call Type Message Only Information Provided ?Reason for Call Request to Schedule Office Appointment ?Initial Comment Caller states his right thumb is swollen and he would like to know what to do. ?Patient request to speak to RN No ?Disp. Time Disposition Final User ?11/12/2021 7:24:04 AM General Information Provided Yes Marlow Baars, [place] ?Call Closed By: Ellis Parents ?Transaction Date/Time: 11/12/2021 7:21:35 AM (ET) ?

## 2021-11-12 NOTE — Progress Notes (Signed)
Patient ID: Clinton Watts, male    DOB: 1956/05/09, 66 y.o.   MRN: [id number]  This visit was conducted in person.  BP (!) 150/70   Pulse 60   Temp (!) 97.3 F (36.3 C) (Temporal)   Ht 5\' 8"  (1.727 m)   Wt 174 lb 3 oz (79 kg)   SpO2 98%   BMI 26.49 kg/m    CC:  Chief Complaint  Patient presents with   Hand Pain    Right Thumb painful and swollen x 1 day     Subjective:   HPI: Clinton Watts is a 66 y.o. male  patient of Dr. 73  with DM, HIV , HTN presenting on 11/12/2021 for Hand Pain (Right Thumb painful and swollen x 1 day )  He reports new onset pain in right thumb in last 24 hours.  All of a sudden noted swelling, painful, no discharge.  No known injury, no fall.  No bite. No cuts.   Was gardening in recent days.   He is immunocompromised.   No recent antibiotic.  Lab Results  Component Value Date   HGBA1C 7.2 (A) 09/10/2021          Relevant past medical, surgical, family and social history reviewed and updated as indicated. Interim medical history since our last visit reviewed. Allergies and medications reviewed and updated. Outpatient Medications Prior to Visit  Medication Sig Dispense Refill   dolutegravir-lamiVUDine (DOVATO) 50-300 MG tablet Take 1 tablet by mouth daily. 90 tablet 0   glipiZIDE (GLUCOTROL XL) 10 MG 24 hr tablet Take 1 tablet (10 mg total) by mouth daily with breakfast. 90 tablet 3   losartan (COZAAR) 100 MG tablet Take 1 tablet (100 mg total) by mouth daily. 90 tablet 3   metFORMIN (GLUCOPHAGE XR) 500 MG 24 hr tablet Take 2 tablets (1,000 mg total) by mouth daily with breakfast. 180 tablet 3   rosuvastatin (CRESTOR) 20 MG tablet Take 1 tablet (20 mg total) by mouth daily. 90 tablet 3   sildenafil (REVATIO) 20 MG tablet TAKE 2 TO 5 TABLETS ONE HOUR PRIOR TO INTERCOURSE 90 tablet 3   tadalafil (CIALIS) 20 MG tablet 1 tablet 1 hour prior to intercourse 10 tablet 0   No facility-administered medications prior to visit.     Per HPI  unless specifically indicated in ROS section below Review of Systems  Constitutional:  Negative for fatigue and fever.  HENT:  Negative for ear pain.   Eyes:  Negative for pain.  Respiratory:  Negative for cough and shortness of breath.   Cardiovascular:  Negative for chest pain, palpitations and leg swelling.  Gastrointestinal:  Negative for abdominal pain.  Genitourinary:  Negative for dysuria.  Musculoskeletal:  Negative for arthralgias.  Neurological:  Negative for syncope, light-headedness and headaches.  Psychiatric/Behavioral:  Negative for dysphoric mood.    Objective:  BP (!) 150/70   Pulse 60   Temp (!) 97.3 F (36.3 C) (Temporal)   Ht 5\' 8"  (1.727 m)   Wt 174 lb 3 oz (79 kg)   SpO2 98%   BMI 26.49 kg/m   Wt Readings from Last 3 Encounters:  11/12/21 174 lb 3 oz (79 kg)  09/10/21 177 lb (80.3 kg)  09/02/21 170 lb (77.1 kg)      Physical Exam Constitutional:      Appearance: He is well-developed.  HENT:     Head: Normocephalic.     Right Ear: Hearing normal.     Left Ear:  Hearing normal.     Nose: Nose normal.  Neck:     Thyroid: No thyroid mass or thyromegaly.     Vascular: No carotid bruit.     Trachea: Trachea normal.  Cardiovascular:     Rate and Rhythm: Normal rate and regular rhythm.     Pulses: Normal pulses.     Heart sounds: Heart sounds not distant. No murmur heard.    No friction rub. No gallop.     Comments: No peripheral edema Pulmonary:     Effort: Pulmonary effort is normal. No respiratory distress.     Breath sounds: Normal breath sounds.  Skin:    General: Skin is warm and dry.     Findings: No rash.  Psychiatric:        Speech: Speech normal.        Behavior: Behavior normal.        Thought Content: Thought content normal.          Results for orders placed or performed in visit on 09/10/21  HgB A1c  Result Value Ref Range   Hemoglobin A1C 7.2 (A) 4.0 - 5.6 %   HbA1c POC (<> result, manual entry)     HbA1c, POC  (prediabetic range)     HbA1c, POC (controlled diabetic range)       COVID 19 screen:  No recent travel or known exposure to COVID19 The patient denies respiratory symptoms of COVID 19 at this time. The importance of social distancing was discussed today.   Assessment and Plan Problem List Items Addressed This Visit     Immunocompromised (HCC)   Thumb pain, right - Primary    Concerning for possible nail pulp infection/felon.. possible injury  While gardening.  Risk factors DM and HIV history (both treated).  Given early on.. will start with  Ceftriaxone 1 gm x I, oral antibiotics, elevation , rest and warm soaks.  Treat with augmentin BID x 10 days.  Treat pain with ibuprofen 800 mg three times daily for pain.  Return and ER precautions given.       Meds ordered this encounter  Medications   amoxicillin-clavulanate (AUGMENTIN) 875-125 MG tablet    Sig: Take 1 tablet by mouth 2 (two) times daily.    Dispense:  20 tablet    Refill:  0   cefTRIAXone (ROCEPHIN) injection 1 g     Kerby Nora, MD

## 2021-11-12 NOTE — Telephone Encounter (Signed)
Pt called and stated he would like to get a xray done on his right thumb, states "It is swollen and hurts really bad."  ? ?Callback Number: [phone] ?

## 2021-11-12 NOTE — Patient Instructions (Addendum)
Complete oral antibiotics, elevate thumb, start warm soaks three times daily. ? Treat with augmentin BID x 10 days.Marland Kitchen start tommorow. ? Treat pain with ibuprofen 800 mg three times daily for pain. ? Call  or be seen at urgent care if pain increasing,  redness spreading ,new fever or not tolerating antibiotics. ? ? ?

## 2021-11-12 NOTE — Telephone Encounter (Signed)
Patient scheduled to see Dr. Diona Browner today 11/12/21 at 12:00  noon. ?

## 2021-11-12 NOTE — Telephone Encounter (Signed)
Can't do xray w/o appt, called pt and got him scheduled with an available provider today ?

## 2021-11-12 NOTE — Assessment & Plan Note (Signed)
Concerning for possible nail pulp infection/felon.. possible injury  While gardening. ? ?Risk factors DM and HIV history (both treated). ? ?Given early on.. will start with  Ceftriaxone 1 gm x I, oral antibiotics, elevation , rest and warm soaks. ? Treat with augmentin BID x 10 days. ? Treat pain with ibuprofen 800 mg three times daily for pain. ? Return and ER precautions given. ? ?

## 2021-11-15 ENCOUNTER — Encounter: Payer: Self-pay | Admitting: Family Medicine

## 2021-11-15 ENCOUNTER — Ambulatory Visit: Payer: BC Managed Care – PPO | Admitting: Family Medicine

## 2021-11-15 VITALS — BP 162/88 | HR 80 | Temp 97.9°F | Ht 68.0 in | Wt 175.8 lb

## 2021-11-15 DIAGNOSIS — L02511 Cutaneous abscess of right hand: Secondary | ICD-10-CM | POA: Diagnosis not present

## 2021-11-15 DIAGNOSIS — M79644 Pain in right finger(s): Secondary | ICD-10-CM | POA: Diagnosis not present

## 2021-11-15 DIAGNOSIS — L089 Local infection of the skin and subcutaneous tissue, unspecified: Secondary | ICD-10-CM

## 2021-11-15 NOTE — Patient Instructions (Signed)
Please head over to the [facility] walk in clinic  ? ?[facility] [place]  ? ?Our referral coordinator is calling them  ?

## 2021-11-15 NOTE — Assessment & Plan Note (Signed)
Worse despite abx tx  ?Now swelling/erythema down to mcp joint  ?Very sore/tender/ pain severe at times ? ?Sent to [facility] now to get hand specialist attention  ? ?ER precautions discussed  ?

## 2021-11-15 NOTE — Progress Notes (Signed)
? ?Subjective:  ? ? Patient ID: Clinton Watts, male    DOB: Feb 18, 1956, 66 y.o.   MRN: [id number] ? ?HPI ?Pt presents for thumb pain  ? ?Wt Readings from Last 3 Encounters:  ?11/15/21 175 lb 12.8 oz (79.7 kg)  ?11/12/21 174 lb 3 oz (79 kg)  ?09/10/21 177 lb (80.3 kg)  ? ?26.73 kg/m? ? ? ?Was seen by Dr Ermalene Searing on 5/12 ?Concern for nail pulm infection/felon ?Possible injury while gardening ? ?Unsure exactly what happened  ? ? ? ?Very distant finger injury  ? ? ?Was given cefriaxone 1 gm and augmentin bid for 10 d ?Inst do do warm soaks  ? ?Risk factors include DM2 and HIV history  ?Lab Results  ?Component Value Date  ? HGBA1C 7.2 (A) 09/10/2021  ? ?This is not improved  ?Really hurts and throbs  ?He cannot sleep  ? ?Patient Active Problem List  ? Diagnosis Date Noted  ? Infection of thumb 11/15/2021  ? Thumb pain, right 11/12/2021  ? Immunocompromised (HCC) 11/12/2021  ? Cerumen impaction 02/08/2021  ? Therapeutic drug monitoring 10/09/2020  ? Erectile dysfunction due to arterial insufficiency 07/30/2019  ? Hypogonadism in male 07/13/2019  ? Essential hypertension 01/22/2018  ? Human immunodeficiency virus (HIV) disease (HCC) 10/12/2017  ? Healthcare maintenance 10/12/2017  ? Erectile dysfunction 10/12/2017  ? History of hernia repair 08/26/2017  ? Type 2 diabetes mellitus without complication, without long-term current use of insulin (HCC) 08/26/2017  ? Hyperlipidemia associated with type 2 diabetes mellitus (HCC) 08/26/2017  ? ?Past Medical History:  ?Diagnosis Date  ? Diabetes mellitus without complication (HCC)   ? ED (erectile dysfunction)   ? HIV (human immunodeficiency virus infection) (HCC) 10/12/2017  ? Hyperlipidemia   ? ?Past Surgical History:  ?Procedure Laterality Date  ? HERNIA REPAIR    ? ?Social History  ? ?Tobacco Use  ? Smoking status: Never  ? Smokeless tobacco: Never  ?Vaping Use  ? Vaping Use: Never used  ?Substance Use Topics  ? Alcohol use: No  ? Drug use: No  ? ?Family History  ?Problem Relation  Age of Onset  ? Diabetes Mother   ? Stomach cancer Sister   ? Pancreatic cancer Brother   ? Prostate cancer Neg Hx   ? Bladder Cancer Neg Hx   ? Kidney cancer Neg Hx   ? ?No Known Allergies ?Current Outpatient Medications on File Prior to Visit  ?Medication Sig Dispense Refill  ? amoxicillin-clavulanate (AUGMENTIN) 875-125 MG tablet Take 1 tablet by mouth 2 (two) times daily. 20 tablet 0  ? dolutegravir-lamiVUDine (DOVATO) 50-300 MG tablet Take 1 tablet by mouth daily. 90 tablet 0  ? glipiZIDE (GLUCOTROL XL) 10 MG 24 hr tablet Take 1 tablet (10 mg total) by mouth daily with breakfast. 90 tablet 3  ? losartan (COZAAR) 100 MG tablet Take 1 tablet (100 mg total) by mouth daily. 90 tablet 3  ? metFORMIN (GLUCOPHAGE XR) 500 MG 24 hr tablet Take 2 tablets (1,000 mg total) by mouth daily with breakfast. 180 tablet 3  ? rosuvastatin (CRESTOR) 20 MG tablet Take 1 tablet (20 mg total) by mouth daily. 90 tablet 3  ? sildenafil (REVATIO) 20 MG tablet TAKE 2 TO 5 TABLETS ONE HOUR PRIOR TO INTERCOURSE 90 tablet 3  ? tadalafil (CIALIS) 20 MG tablet 1 tablet 1 hour prior to intercourse 10 tablet 0  ? ?No current facility-administered medications on file prior to visit.  ? ? ?Review of Systems  ?Constitutional:  Positive for fatigue.  Negative for chills and fever.  ?Musculoskeletal:   ?     R thumb severe pain with swelling and redness  ? ?   ?Objective:  ? Physical Exam ?Constitutional:   ?   General: He is not in acute distress. ?   Appearance: Normal appearance. He is not ill-appearing or diaphoretic.  ?Cardiovascular:  ?   Rate and Rhythm: Normal rate and regular rhythm.  ?Pulmonary:  ?   Effort: No respiratory distress.  ?   Breath sounds: Normal breath sounds.  ?Skin: ?   General: Skin is warm and dry.  ?   Comments:  ?R thumb -erythema and tight swelling to base of thumb with much tenderness  ?Area of pale fluctuance in center of palmar aspect  ? ? ?  ?Neurological:  ?   Mental Status: He is alert.  ?   Sensory: No sensory  deficit.  ?Psychiatric:     ?   Mood and Affect: Mood normal.  ? ? ? ? ? ? ?   ?Assessment & Plan:  ? ?Problem List Items Addressed This Visit   ? ?  ? Other  ? Infection of thumb  ?  Worse despite abx tx  ?Now swelling/erythema down to mcp joint  ?Very sore/tender/ pain severe at times ? ?[facility] to [facility] now to get hand specialist attention  ? ?ER precautions discussed  ? ?  ?  ? Relevant Orders  ? Ambulatory referral to Hand Surgery  ? Thumb pain, right - Primary  ? Relevant Orders  ? Ambulatory referral to Hand Surgery  ? ? ? ?

## 2021-11-24 DIAGNOSIS — L02511 Cutaneous abscess of right hand: Secondary | ICD-10-CM | POA: Diagnosis not present

## 2021-11-26 DIAGNOSIS — L02511 Cutaneous abscess of right hand: Secondary | ICD-10-CM | POA: Diagnosis not present

## 2021-12-02 ENCOUNTER — Other Ambulatory Visit (HOSPITAL_COMMUNITY): Payer: Self-pay

## 2021-12-06 ENCOUNTER — Other Ambulatory Visit: Payer: Self-pay | Admitting: Pharmacist

## 2021-12-06 ENCOUNTER — Other Ambulatory Visit (HOSPITAL_COMMUNITY): Payer: Self-pay

## 2021-12-06 DIAGNOSIS — B20 Human immunodeficiency virus [HIV] disease: Secondary | ICD-10-CM

## 2021-12-06 MED ORDER — DOVATO 50-300 MG PO TABS
1.0000 | ORAL_TABLET | Freq: Every day | ORAL | 0 refills | Status: DC
  Filled 2021-12-06: qty 30, 30d supply, fill #0
  Filled 2022-01-03: qty 30, 30d supply, fill #1
  Filled 2022-01-25: qty 30, 30d supply, fill #2

## 2021-12-08 ENCOUNTER — Other Ambulatory Visit (HOSPITAL_COMMUNITY): Payer: Self-pay

## 2021-12-09 ENCOUNTER — Other Ambulatory Visit: Payer: Self-pay | Admitting: Family Medicine

## 2021-12-09 DIAGNOSIS — I10 Essential (primary) hypertension: Secondary | ICD-10-CM

## 2021-12-09 DIAGNOSIS — E119 Type 2 diabetes mellitus without complications: Secondary | ICD-10-CM

## 2022-01-03 ENCOUNTER — Other Ambulatory Visit (HOSPITAL_COMMUNITY): Payer: Self-pay

## 2022-01-21 ENCOUNTER — Other Ambulatory Visit: Payer: Self-pay | Admitting: Family Medicine

## 2022-01-21 DIAGNOSIS — E1169 Type 2 diabetes mellitus with other specified complication: Secondary | ICD-10-CM

## 2022-01-24 NOTE — Telephone Encounter (Signed)
Left a message on voicemail for patient to call the office back.  Need to find out if patient is using mail order now for his Crestor.

## 2022-01-25 ENCOUNTER — Other Ambulatory Visit (HOSPITAL_COMMUNITY): Payer: Self-pay

## 2022-01-27 ENCOUNTER — Other Ambulatory Visit (HOSPITAL_COMMUNITY): Payer: Self-pay

## 2022-01-27 NOTE — Telephone Encounter (Signed)
Looks like a refill was sent to [facility] on 09/10/21, #90/3.  Spoke with pt asking if he uses [facility] or [facility] mail order for rosuvastatin (Crestor) rx.  Says he uses both.  I notified him there are refills available at [facility] until 09/2022.  Pt verbalizes understanding and asks that we not send rx to [facility] at this time.   Request denied.

## 2022-02-01 ENCOUNTER — Other Ambulatory Visit (HOSPITAL_COMMUNITY): Payer: Self-pay

## 2022-02-04 DIAGNOSIS — H5203 Hypermetropia, bilateral: Secondary | ICD-10-CM | POA: Diagnosis not present

## 2022-02-04 DIAGNOSIS — H524 Presbyopia: Secondary | ICD-10-CM | POA: Diagnosis not present

## 2022-02-04 DIAGNOSIS — E119 Type 2 diabetes mellitus without complications: Secondary | ICD-10-CM | POA: Diagnosis not present

## 2022-02-04 DIAGNOSIS — H04123 Dry eye syndrome of bilateral lacrimal glands: Secondary | ICD-10-CM | POA: Diagnosis not present

## 2022-02-04 DIAGNOSIS — H52223 Regular astigmatism, bilateral: Secondary | ICD-10-CM | POA: Diagnosis not present

## 2022-02-04 LAB — HM DIABETES EYE EXAM

## 2022-02-16 ENCOUNTER — Encounter: Payer: Self-pay | Admitting: Family Medicine

## 2022-02-18 ENCOUNTER — Other Ambulatory Visit: Payer: Self-pay | Admitting: Infectious Diseases

## 2022-02-18 ENCOUNTER — Other Ambulatory Visit (HOSPITAL_COMMUNITY): Payer: Self-pay

## 2022-02-18 DIAGNOSIS — B20 Human immunodeficiency virus [HIV] disease: Secondary | ICD-10-CM

## 2022-02-18 MED ORDER — DOVATO 50-300 MG PO TABS
1.0000 | ORAL_TABLET | Freq: Every day | ORAL | 0 refills | Status: DC
  Filled 2022-02-18: qty 30, 30d supply, fill #0
  Filled 2022-03-15: qty 30, 30d supply, fill #1
  Filled 2022-04-12: qty 30, 30d supply, fill #2

## 2022-02-22 ENCOUNTER — Other Ambulatory Visit (HOSPITAL_COMMUNITY): Payer: Self-pay

## 2022-03-15 ENCOUNTER — Other Ambulatory Visit (HOSPITAL_COMMUNITY): Payer: Self-pay

## 2022-03-18 ENCOUNTER — Ambulatory Visit: Payer: BC Managed Care – PPO | Admitting: Family Medicine

## 2022-03-21 ENCOUNTER — Other Ambulatory Visit (HOSPITAL_COMMUNITY): Payer: Self-pay

## 2022-04-01 ENCOUNTER — Encounter: Payer: Self-pay | Admitting: Urology

## 2022-04-12 ENCOUNTER — Other Ambulatory Visit (HOSPITAL_COMMUNITY): Payer: Self-pay

## 2022-04-13 ENCOUNTER — Other Ambulatory Visit (HOSPITAL_COMMUNITY): Payer: Self-pay

## 2022-04-29 ENCOUNTER — Other Ambulatory Visit: Payer: Self-pay | Admitting: Urology

## 2022-05-09 ENCOUNTER — Other Ambulatory Visit (HOSPITAL_COMMUNITY): Payer: Self-pay

## 2022-05-11 ENCOUNTER — Other Ambulatory Visit (HOSPITAL_COMMUNITY): Payer: Self-pay

## 2022-05-11 ENCOUNTER — Other Ambulatory Visit: Payer: Self-pay | Admitting: Infectious Diseases

## 2022-05-11 DIAGNOSIS — B20 Human immunodeficiency virus [HIV] disease: Secondary | ICD-10-CM

## 2022-05-17 ENCOUNTER — Encounter: Payer: Self-pay | Admitting: Infectious Diseases

## 2022-05-17 ENCOUNTER — Other Ambulatory Visit (HOSPITAL_COMMUNITY): Payer: Self-pay

## 2022-05-17 ENCOUNTER — Other Ambulatory Visit: Payer: Self-pay

## 2022-05-17 ENCOUNTER — Ambulatory Visit (INDEPENDENT_AMBULATORY_CARE_PROVIDER_SITE_OTHER): Payer: BC Managed Care – PPO | Admitting: Infectious Diseases

## 2022-05-17 ENCOUNTER — Ambulatory Visit (INDEPENDENT_AMBULATORY_CARE_PROVIDER_SITE_OTHER): Payer: BC Managed Care – PPO

## 2022-05-17 VITALS — Resp 16 | Ht 68.0 in

## 2022-05-17 DIAGNOSIS — Z0000 Encounter for general adult medical examination without abnormal findings: Secondary | ICD-10-CM | POA: Diagnosis not present

## 2022-05-17 DIAGNOSIS — Z23 Encounter for immunization: Secondary | ICD-10-CM | POA: Diagnosis not present

## 2022-05-17 DIAGNOSIS — B20 Human immunodeficiency virus [HIV] disease: Secondary | ICD-10-CM | POA: Diagnosis not present

## 2022-05-17 MED ORDER — DOVATO 50-300 MG PO TABS
1.0000 | ORAL_TABLET | Freq: Every day | ORAL | 3 refills | Status: DC
  Filled 2022-05-17: qty 30, 30d supply, fill #0
  Filled 2022-06-06 – 2022-06-08 (×2): qty 30, 30d supply, fill #1
  Filled 2022-06-29: qty 30, 30d supply, fill #2
  Filled 2022-07-26: qty 30, 30d supply, fill #3
  Filled 2022-08-23: qty 30, 30d supply, fill #4
  Filled 2022-09-15: qty 30, 30d supply, fill #5
  Filled 2022-10-11: qty 30, 30d supply, fill #6
  Filled 2022-11-10: qty 30, 30d supply, fill #7
  Filled 2022-12-06: qty 30, 30d supply, fill #8
  Filled 2023-01-04: qty 30, 30d supply, fill #9
  Filled 2023-02-02: qty 30, 30d supply, fill #10
  Filled 2023-03-02: qty 30, 30d supply, fill #11

## 2022-05-17 NOTE — Patient Instructions (Signed)
Nice to see you!   Please continue the Dovato everyday. Stop by the lab to update your blood work.   Would like to see you back in 6 months - can do labs ahead of time or same day. Please schedule to your preference.   Happy Holidays!

## 2022-05-17 NOTE — Progress Notes (Signed)
Name: Clinton Watts  UXN:[id number]   DOB: [birth year]   PCP: Judy Pimple, MD    Subjective   Clinton Watts is a 66 y.o. male HIV disease Dx 23. Originally from [place].  HIV Risk: heterosexual contact. Previously in care with [facility] in [place] [phone]).  Hx OIs: none known     Previous Regimens: Combivir + Crixivan Atripla --> well controlled and undetectable for years, switched for TAF regimen Biktarvy 10/2017 - suppressed Dovato 09/2021    Genotypes: None on file or from previous provider    HPI:  [name] is here for routine follow-up of HIV care after switching from [place] to once daily Dovato.  He has not noticed any side effects with the switch in hopes that his medicine is working just as good as the last.  He takes it every day without lapse.  Its been much better getting shipments from [facility] though he would appreciate a 42-month supply at a time if possible.  Medical/surgical/social/family history have been updated during today's visit.  He is requesting COVID booster.     Review of Systems  Constitutional:  Negative for appetite change, chills, fatigue, fever and unexpected weight change.  Eyes:  Negative for visual disturbance.  Respiratory:  Negative for cough and shortness of breath.   Cardiovascular:  Negative for chest pain and leg swelling.  Gastrointestinal:  Negative for abdominal pain, diarrhea and nausea.  Genitourinary:  Negative for dysuria, genital sores and penile discharge.  Musculoskeletal:  Negative for joint swelling.  Skin:  Negative for color change and rash.  Neurological:  Negative for dizziness and headaches.  Hematological:  Negative for adenopathy.  Psychiatric/Behavioral:  Negative for sleep disturbance. The patient is not nervous/anxious.      Objective   Today's Vitals   05/17/22 1348  Resp: 16  Height: 5\' 8"  (1.727 m)   Body mass index is 26.73 kg/m.  Estimated body mass index is 26.73 kg/m  as calculated from the following:   Height as of this encounter: 5\' 8"  (1.727 m).   Weight as of 11/15/21: 175 lb 12.8 oz (79.7 kg).      Physical Exam Vitals and nursing note reviewed.  Constitutional:      Appearance: He is well-developed.     Comments: Seated comfortably in chair during visit.   HENT:     Mouth/Throat:     Dentition: Normal dentition. No dental abscesses.  Cardiovascular:     Rate and Rhythm: Normal rate and regular rhythm.     Heart sounds: Normal heart sounds.  Pulmonary:     Effort: Pulmonary effort is normal.     Breath sounds: Normal breath sounds.  Abdominal:     General: There is no distension.     Palpations: Abdomen is soft.     Tenderness: There is no abdominal tenderness.  Lymphadenopathy:     Cervical: No cervical adenopathy.  Skin:    General: Skin is warm and dry.     Findings: No rash.  Neurological:     Mental Status: He is alert and oriented to person, place, and time.  Psychiatric:        Judgment: Judgment normal.     Comments: In good spirits today and engaged in care discussion.      LABS:  HIV 1 RNA Quant  Date Value  08/13/2021 Not Detected Copies/mL  09/23/2020 <20 Copies/mL (H)  08/12/2019 <20 NOT DETECTED copies/mL   CD4 T Cell Abs (/uL)  Date Value  09/23/2020 404  08/12/2019 502  09/19/2018 500    Lab Results  Component Value Date   CREATININE 1.06 10/30/2020   CREATININE 1.10 09/23/2020   CREATININE 1.09 07/14/2020    Lab Results  Component Value Date   WBC 6.6 07/14/2020   HGB 13.4 07/14/2020   HCT 41.4 07/14/2020   MCV 65.8 Repeated and verified X2. (L) 07/14/2020   PLT 226.0 07/14/2020    Lab Results  Component Value Date   ALT 13 09/23/2020   AST 21 09/23/2020   ALKPHOS 101 07/14/2020   BILITOT 0.6 09/23/2020     Assessment and Plan: Problem List Items Addressed This Visit       Unprioritized   Human immunodeficiency virus (HIV) disease (HCC)    He has done well switching from once  daily Biktarvy to once daily Dovato.  He does not seem to be experiencing any side effects.  We will update pertinent blood work today to ensure he remains undetectable.  Would like to see him back again in 6 months for the same.  Perhaps at that time we can consider annual visits if he is comfortable with that. COVID booster provided today. No anal cancer screening recommended. No acute dental needs.  Return in about 6 months (around 11/15/2022).       Relevant Medications   dolutegravir-lamiVUDine (DOVATO) 50-300 MG tablet   Healthcare maintenance    COVID-19 booster provided today.  Had flu shot about a month and a half ago.  He completed Shingrix vaccine series.  We will plan on this during the Prevnar 20 to complete his pneumococcal vaccines at his next office visit.      Other Visit Diagnoses     HIV disease (HCC)    -  Primary   Relevant Medications   dolutegravir-lamiVUDine (DOVATO) 50-300 MG tablet   Other Relevant Orders   HIV 1 RNA quant-no reflex-bld   COMPLETE METABOLIC PANEL WITH GFR      Rexene Alberts, MSN, NP-C [facility]  [email or web address] Pager: [phone] Office: [phone] [facility] Main Line: [phone]

## 2022-05-17 NOTE — Assessment & Plan Note (Addendum)
COVID-19 booster provided today.  Had flu shot about a month and a half ago.  He completed Shingrix vaccine series.  We will plan on this during the Prevnar 20 to complete his pneumococcal vaccines at his next office visit.

## 2022-05-17 NOTE — Assessment & Plan Note (Signed)
He has done well switching from once daily Biktarvy to once daily Dovato.  He does not seem to be experiencing any side effects.  We will update pertinent blood work today to ensure he remains undetectable.  Would like to see him back again in 6 months for the same.  Perhaps at that time we can consider annual visits if he is comfortable with that. COVID booster provided today. No anal cancer screening recommended. No acute dental needs.  Return in about 6 months (around 11/15/2022).

## 2022-05-18 ENCOUNTER — Other Ambulatory Visit (HOSPITAL_COMMUNITY): Payer: Self-pay

## 2022-05-20 LAB — COMPLETE METABOLIC PANEL WITH GFR
AG Ratio: 1.4 (calc) (ref 1.0–2.5)
ALT: 14 U/L (ref 9–46)
AST: 22 U/L (ref 10–35)
Albumin: 4.7 g/dL (ref 3.6–5.1)
Alkaline phosphatase (APISO): 70 U/L (ref 35–144)
BUN: 18 mg/dL (ref 7–25)
CO2: 29 mmol/L (ref 20–32)
Calcium: 9.8 mg/dL (ref 8.6–10.3)
Chloride: 100 mmol/L (ref 98–110)
Creat: 1.05 mg/dL (ref 0.70–1.35)
Globulin: 3.3 g/dL (calc) (ref 1.9–3.7)
Glucose, Bld: 148 mg/dL — ABNORMAL HIGH (ref 65–99)
Potassium: 4.7 mmol/L (ref 3.5–5.3)
Sodium: 134 mmol/L — ABNORMAL LOW (ref 135–146)
Total Bilirubin: 0.5 mg/dL (ref 0.2–1.2)
Total Protein: 8 g/dL (ref 6.1–8.1)
eGFR: 77 mL/min/{1.73_m2} (ref >=60)

## 2022-05-20 LAB — HIV-1 RNA QUANT-NO REFLEX-BLD
HIV 1 RNA Quant: NOT DETECTED Copies/mL
HIV-1 RNA Quant, Log: NOT DETECTED Log cps/mL

## 2022-06-06 ENCOUNTER — Other Ambulatory Visit (HOSPITAL_COMMUNITY): Payer: Self-pay

## 2022-06-08 ENCOUNTER — Other Ambulatory Visit (HOSPITAL_COMMUNITY): Payer: Self-pay

## 2022-06-09 ENCOUNTER — Other Ambulatory Visit: Payer: Self-pay

## 2022-06-29 ENCOUNTER — Other Ambulatory Visit (HOSPITAL_COMMUNITY): Payer: Self-pay

## 2022-07-26 ENCOUNTER — Other Ambulatory Visit (HOSPITAL_COMMUNITY): Payer: Self-pay

## 2022-07-27 ENCOUNTER — Other Ambulatory Visit: Payer: Self-pay

## 2022-07-28 ENCOUNTER — Other Ambulatory Visit: Payer: Self-pay

## 2022-08-23 ENCOUNTER — Other Ambulatory Visit (HOSPITAL_COMMUNITY): Payer: Self-pay

## 2022-08-24 ENCOUNTER — Other Ambulatory Visit (HOSPITAL_COMMUNITY): Payer: Self-pay

## 2022-08-31 ENCOUNTER — Other Ambulatory Visit: Payer: Self-pay | Admitting: Family Medicine

## 2022-08-31 DIAGNOSIS — E1169 Type 2 diabetes mellitus with other specified complication: Secondary | ICD-10-CM

## 2022-09-09 ENCOUNTER — Ambulatory Visit: Payer: BC Managed Care – PPO | Admitting: Urology

## 2022-09-15 ENCOUNTER — Other Ambulatory Visit (HOSPITAL_COMMUNITY): Payer: Self-pay

## 2022-09-16 ENCOUNTER — Ambulatory Visit (INDEPENDENT_AMBULATORY_CARE_PROVIDER_SITE_OTHER): Payer: BC Managed Care – PPO | Admitting: Urology

## 2022-09-16 ENCOUNTER — Encounter: Payer: Self-pay | Admitting: Urology

## 2022-09-16 VITALS — BP 161/77 | HR 71 | Ht 68.0 in | Wt 176.6 lb

## 2022-09-16 DIAGNOSIS — E291 Testicular hypofunction: Secondary | ICD-10-CM

## 2022-09-16 DIAGNOSIS — N5201 Erectile dysfunction due to arterial insufficiency: Secondary | ICD-10-CM | POA: Diagnosis not present

## 2022-09-16 DIAGNOSIS — Z125 Encounter for screening for malignant neoplasm of prostate: Secondary | ICD-10-CM | POA: Diagnosis not present

## 2022-09-16 MED ORDER — SILDENAFIL CITRATE 100 MG PO TABS: ORAL_TABLET | ORAL | 0 refills | Status: DC

## 2022-09-16 NOTE — Patient Instructions (Signed)
[facility] [employer]

## 2022-09-16 NOTE — Progress Notes (Signed)
09/16/2022 2:48 PM   Clinton Watts 1956/03/31 [id number]  Referring provider: Abner Greenspan, MD [address] [place],  [place] [address]  Chief Complaint  Patient presents with   Other    Prostate    Urologic history: 1.  Erectile dysfunction PDE 5 inhibitor therapy    HPI: 67 y.o. male presents for annual follow-up.  Since last visit states he does obtain an erection with sildenafil and tadalafil however has difficulty maintaining the erection Also notes some decreased libido He does have hypogonadism but has declined TRT; last testosterone level was in 2022 No bothersome LUTS Denies dysuria, gross hematuria   PMH: Past Medical History:  Diagnosis Date   Diabetes mellitus without complication ([place])    ED (erectile dysfunction)    HIV (human immunodeficiency virus infection) ([place]) 10/12/2017   Hyperlipidemia     Surgical History: Past Surgical History:  Procedure Laterality Date   HERNIA REPAIR      Home Medications:  Allergies as of 09/16/2022   No Known Allergies      Medication List        Accurate as of September 16, 2022  2:48 PM. If you have any questions, ask your nurse or doctor.          STOP taking these medications    amoxicillin-clavulanate 875-125 MG tablet Commonly known as: AUGMENTIN Stopped by: Abbie Sons, MD       TAKE these medications    Dovato 50-300 MG tablet Generic drug: dolutegravir-lamiVUDine Take 1 tablet by mouth daily.   glipiZIDE 10 MG 24 hr tablet Commonly known as: GLUCOTROL XL TAKE 1 TABLET DAILY WITH   BREAKFAST   losartan 100 MG tablet Commonly known as: COZAAR TAKE 1 TABLET DAILY   metFORMIN 500 MG 24 hr tablet Commonly known as: GLUCOPHAGE-XR TAKE 2 TABLETS DAILY WITH  BREAKFAST   rosuvastatin 20 MG tablet Commonly known as: CRESTOR TAKE 1 TABLET BY MOUTH EVERY DAY   sildenafil 100 MG tablet Commonly known as: VIAGRA Take 1 tablet 1 hour prior to intercourse Started by: Abbie Sons, MD   sildenafil 20 MG tablet Commonly known as: REVATIO TAKE 2-5 TABLETS BY MOUTH ONE HOUR BEFORE INTERCOURSE   tadalafil 20 MG tablet Commonly known as: CIALIS 1 tablet 1 hour prior to intercourse        Allergies: No Known Allergies  Family History: Family History  Problem Relation Age of Onset   Diabetes Mother    Stomach cancer Sister    Pancreatic cancer Brother    Prostate cancer Neg Hx    Bladder Cancer Neg Hx    Kidney cancer Neg Hx     Social History:  reports that he has never smoked. He has never used smokeless tobacco. He reports that he does not drink alcohol and does not use drugs.   Physical Exam: BP (!) 161/77   Pulse 71   Ht 5\' 8"  (1.727 m)   Wt 176 lb 9.6 oz (80.1 kg)   BMI 26.85 kg/m   Constitutional:  Alert and oriented, No acute distress. HEENT: [place] AT, moist mucus membranes.  Trachea midline, no masses. Cardiovascular: No clubbing, cyanosis, or edema. Respiratory: Normal respiratory effort, no increased work of breathing. GU: Declined DRE today Psychiatric: Normal mood and affect.    Assessment & Plan:    1.  Erectile dysfunction Stable He wanted to try the sildenafil 100 mg dose.  Presently taking 3-20 mg tablets Since he has difficulty  maintaining erection discussed use of a venous compression band and he was provided literature on the VenoSeal  2.  Hypogonadism May be a factor in his ED and decreased libido Repeat testosterone level today  2.  Prostate cancer screening Last PSA was over 1 year ago and he desires to continue PSA screening    Abbie Sons, MD  [facility] [address] [place], [place] [address] [phone]

## 2022-09-17 LAB — PSA: Prostate Specific Ag, Serum: 1.2 ng/mL (ref 0.0–4.0)

## 2022-09-17 LAB — TESTOSTERONE: Testosterone: 229 ng/dL — ABNORMAL LOW (ref 264–916)

## 2022-09-19 ENCOUNTER — Other Ambulatory Visit: Payer: Self-pay

## 2022-10-11 ENCOUNTER — Other Ambulatory Visit (HOSPITAL_COMMUNITY): Payer: Self-pay

## 2022-10-17 ENCOUNTER — Other Ambulatory Visit: Payer: Self-pay

## 2022-11-10 ENCOUNTER — Other Ambulatory Visit (HOSPITAL_COMMUNITY): Payer: Self-pay

## 2022-11-10 ENCOUNTER — Encounter: Payer: Self-pay | Admitting: Family Medicine

## 2022-11-10 ENCOUNTER — Ambulatory Visit (INDEPENDENT_AMBULATORY_CARE_PROVIDER_SITE_OTHER): Payer: BC Managed Care – PPO | Admitting: Family Medicine

## 2022-11-10 VITALS — BP 178/78 | HR 78 | Temp 97.6°F | Ht 65.5 in | Wt 169.2 lb

## 2022-11-10 DIAGNOSIS — Z0000 Encounter for general adult medical examination without abnormal findings: Secondary | ICD-10-CM | POA: Diagnosis not present

## 2022-11-10 DIAGNOSIS — E785 Hyperlipidemia, unspecified: Secondary | ICD-10-CM | POA: Diagnosis not present

## 2022-11-10 DIAGNOSIS — Z23 Encounter for immunization: Secondary | ICD-10-CM | POA: Diagnosis not present

## 2022-11-10 DIAGNOSIS — D849 Immunodeficiency, unspecified: Secondary | ICD-10-CM | POA: Diagnosis not present

## 2022-11-10 DIAGNOSIS — E1169 Type 2 diabetes mellitus with other specified complication: Secondary | ICD-10-CM | POA: Diagnosis not present

## 2022-11-10 DIAGNOSIS — I10 Essential (primary) hypertension: Secondary | ICD-10-CM | POA: Diagnosis not present

## 2022-11-10 DIAGNOSIS — E119 Type 2 diabetes mellitus without complications: Secondary | ICD-10-CM

## 2022-11-10 DIAGNOSIS — Z7984 Long term (current) use of oral hypoglycemic drugs: Secondary | ICD-10-CM

## 2022-11-10 DIAGNOSIS — B20 Human immunodeficiency virus [HIV] disease: Secondary | ICD-10-CM

## 2022-11-10 LAB — CBC WITH DIFFERENTIAL/PLATELET
Basophils Absolute: 0.1 10*3/uL (ref 0.0–0.1)
Basophils Relative: 0.8 % (ref 0.0–3.0)
Eosinophils Absolute: 0.1 10*3/uL (ref 0.0–0.7)
Eosinophils Relative: 2.2 % (ref 0.0–5.0)
HCT: 41.4 % (ref 39.0–52.0)
Hemoglobin: 12.9 g/dL — ABNORMAL LOW (ref 13.0–17.0)
Lymphocytes Relative: 32.6 % (ref 12.0–46.0)
Lymphs Abs: 2.1 10*3/uL (ref 0.7–4.0)
MCHC: 31.1 g/dL (ref 30.0–36.0)
MCV: 67.4 fl — ABNORMAL LOW (ref 78.0–100.0)
Monocytes Absolute: 0.6 10*3/uL (ref 0.1–1.0)
Monocytes Relative: 8.6 % (ref 3.0–12.0)
Neutro Abs: 3.6 10*3/uL (ref 1.4–7.7)
Neutrophils Relative %: 55.8 % (ref 43.0–77.0)
Platelets: 219 10*3/uL (ref 150.0–400.0)
RBC: 6.14 Mil/uL — ABNORMAL HIGH (ref 4.22–5.81)
RDW: 15.6 % — ABNORMAL HIGH (ref 11.5–15.5)
WBC: 6.5 10*3/uL (ref 4.0–10.5)

## 2022-11-10 LAB — LIPID PANEL
Cholesterol: 138 mg/dL (ref 0–200)
HDL: 32.9 mg/dL — ABNORMAL LOW (ref >39.00)
NonHDL: 105.54
Total CHOL/HDL Ratio: 4
Triglycerides: 215 mg/dL — ABNORMAL HIGH (ref 0.0–149.0)
VLDL: 43 mg/dL — ABNORMAL HIGH (ref 0.0–40.0)

## 2022-11-10 LAB — MICROALBUMIN / CREATININE URINE RATIO
Creatinine,U: 108.4 mg/dL
Microalb Creat Ratio: 0.8 mg/g (ref 0.0–30.0)
Microalb, Ur: 0.9 mg/dL (ref 0.0–1.9)

## 2022-11-10 LAB — COMPREHENSIVE METABOLIC PANEL
ALT: 10 U/L (ref 0–53)
AST: 21 U/L (ref 0–37)
Albumin: 4.5 g/dL (ref 3.5–5.2)
Alkaline Phosphatase: 73 U/L (ref 39–117)
BUN: 25 mg/dL — ABNORMAL HIGH (ref 6–23)
CO2: 29 mEq/L (ref 19–32)
Calcium: 10.2 mg/dL (ref 8.4–10.5)
Chloride: 101 mEq/L (ref 96–112)
Creatinine, Ser: 1.03 mg/dL (ref 0.40–1.50)
GFR: 74.34 mL/min (ref >60.00)
Glucose, Bld: 186 mg/dL — ABNORMAL HIGH (ref 70–99)
Potassium: 5.4 mEq/L — ABNORMAL HIGH (ref 3.5–5.1)
Sodium: 138 mEq/L (ref 135–145)
Total Bilirubin: 0.5 mg/dL (ref 0.2–1.2)
Total Protein: 7.9 g/dL (ref 6.0–8.3)

## 2022-11-10 LAB — LDL CHOLESTEROL, DIRECT: Direct LDL: 70 mg/dL

## 2022-11-10 LAB — HEMOGLOBIN A1C: Hgb A1c MFr Bld: 11.2 % — ABNORMAL HIGH (ref 4.6–6.5)

## 2022-11-10 LAB — TSH: TSH: 0.99 u[IU]/mL (ref 0.35–5.50)

## 2022-11-10 MED ORDER — AMLODIPINE BESYLATE 5 MG PO TABS: 5.0000 mg | ORAL_TABLET | Freq: Every day | ORAL | 1 refills | Status: DC

## 2022-11-10 NOTE — Assessment & Plan Note (Signed)
Reviewed last ID note Doing well Prevnar 20 updated today

## 2022-11-10 NOTE — Progress Notes (Signed)
Subjective:    Patient ID: Clinton Watts, male    DOB: 03/26/1956, 67 y.o.   MRN: [id number]  HPI Here for health maintenance exam and to review chronic medical problems   Wt Readings from Last 3 Encounters:  11/10/22 169 lb 4 oz (76.8 kg)  09/16/22 176 lb 9.6 oz (80.1 kg)  11/15/21 175 lb 12.8 oz (79.7 kg)   27.74 kg/m  Vitals:   11/10/22 0919 11/10/22 0924  BP: (!) 182/80 (!) 178/78  Pulse: 78   Temp: 97.6 F (36.4 C)   SpO2: 96%    Doing well overall  Taking care of himself  Still working   Walking  Gardening  When outdoors does not go in gym     Immunization History  Administered Date(s) Administered   COVID-19, mRNA, vaccine(Comirnaty)12 years and older 05/17/2022   Fluad Quad(high Dose 65+) 03/26/2019   Influenza Inj Mdck Quad Pf 03/20/2018   Influenza,inj,Quad PF,6+ Mos 02/20/2017   Influenza-Unspecified 03/18/2020, 04/03/2022   PFIZER Comirnaty(Gray Top)Covid-19 Tri-Sucrose Vaccine 10/09/2020   PFIZER(Purple Top)SARS-COV-2 Vaccination 09/27/2019, 10/18/2019, 04/17/2020   Pneumococcal Conjugate-13 03/13/2013   Pneumococcal Polysaccharide-23 07/22/2014   Zoster Recombinat (Shingrix) 10/16/2020, 01/15/2021   Health Maintenance Due  Topic Date Due   Diabetic kidney evaluation - Urine ACR  Never done   DTaP/Tdap/Td (1 - Tdap) Never done   COLONOSCOPY (Pts 45-56yrs Insurance coverage will need to be confirmed)  Never done   Pneumonia Vaccine 51+ Years old (3 of 3 - PPSV23 or PCV20) 07/23/2019   HEMOGLOBIN A1C  03/13/2022   COVID-19 Vaccine (6 - 2023-24 season) 07/12/2022   FOOT EXAM  09/11/2022   Prostate health Lab Results  Component Value Date   PSA1 1.2 09/16/2022   PSA1 1.1 08/23/2021   PSA1 0.8 07/27/2020   PSA 0.5 02/29/2016   Sees urology Treated for ED   Tetanus shot -wants today   Colon cancer screening - had a colonoscopy in [place] around 2017 - 10 year recall   Pna 23 was in 2016 Due prevnar 20-wants today   HIV treatment - going  very well/rev notes  Mood is good    11/10/2022    9:26 AM 05/17/2022    1:48 PM 10/04/2018    9:37 AM 01/22/2018   10:51 AM 10/12/2017    1:45 PM  Depression screen PHQ 2/9  Decreased Interest 0 0 0 0 0  Down, Depressed, Hopeless 0 0 0 0 0  PHQ - 2 Score 0 0 0 0 0      HTN  BP Readings from Last 3 Encounters:  11/10/22 (!) 178/78  09/16/22 (!) 161/77  11/15/21 (!) 162/88   Pulse Readings from Last 3 Encounters:  11/10/22 78  09/16/22 71  11/15/21 80    Losartan 100 mg daily -took it this am   Normally runs 160s systolic     DM2 Lab Results  Component Value Date   HGBA1C 7.2 (A) 09/10/2021   Metoformin xr 1000 mg daily  Glipizide xl 10 mg daily   Eye exam utd aug   Due for microalb  Hyperlipidemia Lab Results  Component Value Date   CHOL 136 07/14/2020   HDL 30.70 (L) 07/14/2020   LDLCALC 51 08/12/2019   LDLDIRECT 67.0 07/14/2020   TRIG 300.0 (H) 07/14/2020   CHOLHDL 4 07/14/2020   Due for labs  Crestor 20 mg daily   Diet is fair  Avoids salt Avoids starchy foods  No sweets   Patient Active Problem  List   Diagnosis Date Noted   Routine general medical examination at a health care facility 11/10/2022   Infection of thumb 11/15/2021   Thumb pain, right 11/12/2021   Immunocompromised (HCC) 11/12/2021   Therapeutic drug monitoring 10/09/2020   Erectile dysfunction due to arterial insufficiency 07/30/2019   Hypogonadism in male 07/13/2019   Essential hypertension 01/22/2018   Human immunodeficiency virus (HIV) disease (HCC) 10/12/2017   Healthcare maintenance 10/12/2017   Erectile dysfunction 10/12/2017   History of hernia repair 08/26/2017   Type 2 diabetes mellitus without complication, without long-term current use of insulin (HCC) 08/26/2017   Hyperlipidemia associated with type 2 diabetes mellitus (HCC) 08/26/2017   Past Medical History:  Diagnosis Date   Diabetes mellitus without complication (HCC)    ED (erectile dysfunction)    HIV  (human immunodeficiency virus infection) (HCC) 10/12/2017   Hyperlipidemia    Past Surgical History:  Procedure Laterality Date   HERNIA REPAIR     Social History   Tobacco Use   Smoking status: Never   Smokeless tobacco: Never  Vaping Use   Vaping Use: Never used  Substance Use Topics   Alcohol use: No   Drug use: No   Family History  Problem Relation Age of Onset   Diabetes Mother    Stomach cancer Sister    Pancreatic cancer Brother    Prostate cancer Neg Hx    Bladder Cancer Neg Hx    Kidney cancer Neg Hx    No Known Allergies Current Outpatient Medications on File Prior to Visit  Medication Sig Dispense Refill   dolutegravir-lamiVUDine (DOVATO) 50-300 MG tablet Take 1 tablet by mouth daily. 90 tablet 3   glipiZIDE (GLUCOTROL XL) 10 MG 24 hr tablet TAKE 1 TABLET DAILY WITH   BREAKFAST 90 tablet 3   losartan (COZAAR) 100 MG tablet TAKE 1 TABLET DAILY 90 tablet 3   metFORMIN (GLUCOPHAGE-XR) 500 MG 24 hr tablet TAKE 2 TABLETS DAILY WITH  BREAKFAST 180 tablet 3   rosuvastatin (CRESTOR) 20 MG tablet TAKE 1 TABLET BY MOUTH EVERY DAY 90 tablet 0   sildenafil (REVATIO) 20 MG tablet TAKE 2-5 TABLETS BY MOUTH ONE HOUR BEFORE INTERCOURSE 90 tablet 3   sildenafil (VIAGRA) 100 MG tablet Take 1 tablet 1 hour prior to intercourse 10 tablet 0   tadalafil (CIALIS) 20 MG tablet 1 tablet 1 hour prior to intercourse 10 tablet 0   No current facility-administered medications on file prior to visit.    Review of Systems  Constitutional:  Negative for activity change, appetite change, fatigue, fever and unexpected weight change.  HENT:  Negative for congestion, rhinorrhea, sore throat and trouble swallowing.   Eyes:  Negative for pain, redness, itching and visual disturbance.  Respiratory:  Negative for cough, chest tightness, shortness of breath and wheezing.   Cardiovascular:  Negative for chest pain and palpitations.  Gastrointestinal:  Negative for abdominal pain, blood in stool,  constipation, diarrhea and nausea.  Endocrine: Negative for cold intolerance, heat intolerance, polydipsia and polyuria.  Genitourinary:  Negative for difficulty urinating, dysuria, frequency and urgency.  Musculoskeletal:  Negative for arthralgias, joint swelling and myalgias.  Skin:  Negative for pallor and rash.  Neurological:  Negative for dizziness, tremors, weakness, numbness and headaches.  Hematological:  Negative for adenopathy. Does not bruise/bleed easily.  Psychiatric/Behavioral:  Negative for decreased concentration and dysphoric mood. The patient is not nervous/anxious.        Objective:   Physical Exam Constitutional:  General: He is not in acute distress.    Appearance: Normal appearance. He is well-developed and normal weight. He is not ill-appearing or diaphoretic.  HENT:     Head: Normocephalic and atraumatic.     Right Ear: Tympanic membrane, ear canal and external ear normal.     Left Ear: Tympanic membrane, ear canal and external ear normal.     Nose: Nose normal. No congestion.     Mouth/Throat:     Mouth: Mucous membranes are moist.     Pharynx: Oropharynx is clear. No posterior oropharyngeal erythema.  Eyes:     General: No scleral icterus.       Right eye: No discharge.        Left eye: No discharge.     Conjunctiva/sclera: Conjunctivae normal.     Pupils: Pupils are equal, round, and reactive to light.  Neck:     Thyroid: No thyromegaly.     Vascular: No carotid bruit or JVD.  Cardiovascular:     Rate and Rhythm: Normal rate and regular rhythm.     Pulses: Normal pulses.     Heart sounds: Normal heart sounds.     No gallop.  Pulmonary:     Effort: Pulmonary effort is normal. No respiratory distress.     Breath sounds: Normal breath sounds. No wheezing or rales.     Comments: Good air exch Chest:     Chest wall: No tenderness.  Abdominal:     General: Bowel sounds are normal. There is no distension or abdominal bruit.     Palpations: Abdomen  is soft. There is no mass.     Tenderness: There is no abdominal tenderness.     Hernia: No hernia is present.  Musculoskeletal:        General: No tenderness.     Cervical back: Normal range of motion and neck supple. No rigidity. No muscular tenderness.     Right lower leg: No edema.     Left lower leg: No edema.  Lymphadenopathy:     Cervical: No cervical adenopathy.  Skin:    General: Skin is warm and dry.     Coloration: Skin is not pale.     Findings: No erythema or rash.  Neurological:     Mental Status: He is alert.     Cranial Nerves: No cranial nerve deficit.     Motor: No abnormal muscle tone.     Coordination: Coordination normal.     Gait: Gait normal.     Deep Tendon Reflexes: Reflexes are normal and symmetric. Reflexes normal.  Psychiatric:        Mood and Affect: Mood normal.        Cognition and Memory: Cognition and memory normal.     Comments: Pleasant            Assessment & Plan:   Problem List Items Addressed This Visit       Cardiovascular and Mediastinum   Essential hypertension    Bp is not at goal BP: (!) 178/78  No clinical changes  At home systolic runs in 952W Discussed goals ofr this   Plan to continue losartan 100 mg daily  Add amlodipine 5 mg daily today Discussed lifestyle change  F/u planned   Consider diuretic if not improved next time       Relevant Medications   amLODipine (NORVASC) 5 MG tablet   Other Relevant Orders   TSH   Lipid panel   Comprehensive metabolic  panel   CBC with Differential/Platelet     Endocrine   Hyperlipidemia associated with type 2 diabetes mellitus (HCC)    Disc goals for lipids and reasons to control them Rev last labs with pt Rev low sat fat diet in detail   Labs ordered Continues crestor 20 mg daily  Tolerates this       Relevant Medications   amLODipine (NORVASC) 5 MG tablet   Other Relevant Orders   Lipid panel   Comprehensive metabolic panel   Type 2 diabetes mellitus  without complication, without long-term current use of insulin (HCC)    A1C ordered  Microalb ordered   Continues Metformin xr 1000 mg daily  Glipizide xl 10 mg daily  Eye exam utd Normal foot exam  Follow up planned       Relevant Orders   Hemoglobin A1c   Microalbumin / creatinine urine ratio     Other   Human immunodeficiency virus (HIV) disease (HCC)    Reviewed last ID note Doing well Prevnar 20 updated today      Immunocompromised (HCC)    Continues treatment for HIV  Doing well      Routine general medical examination at a health care facility - Primary    Reviewed health habits including diet and exercise and skin cancer prevention Reviewed appropriate screening tests for age  Also reviewed health mt list, fam hx and immunization status , as well as social and family history   See HPI Labs reviewed and ordered Normal psa/ rev last urology note Update Td today Update prevnar 20 today  Per pt colonosc was 2017 with 10 y recall  HIV status is stable Eye exam utd  pHQ score is 0 Encouraged good self care      Other Visit Diagnoses     Need for vaccination against Streptococcus pneumoniae       Relevant Orders   Pneumococcal conjugate vaccine 20-valent (Completed)   Need for Td vaccine       Relevant Orders   Td vaccine greater than or equal to 7yo preservative free IM (Completed)

## 2022-11-10 NOTE — Assessment & Plan Note (Signed)
Bp is not at goal BP: (!) 178/78  No clinical changes  At home systolic runs in 161W Discussed goals ofr this   Plan to continue losartan 100 mg daily  Add amlodipine 5 mg daily today Discussed lifestyle change  F/u planned   Consider diuretic if not improved next time

## 2022-11-10 NOTE — Assessment & Plan Note (Signed)
Disc goals for lipids and reasons to control them Rev last labs with pt Rev low sat fat diet in detail   Labs ordered Continues crestor 20 mg daily  Tolerates this

## 2022-11-10 NOTE — Assessment & Plan Note (Signed)
Reviewed health habits including diet and exercise and skin cancer prevention Reviewed appropriate screening tests for age  Also reviewed health mt list, fam hx and immunization status , as well as social and family history   See HPI Labs reviewed and ordered Normal psa/ rev last urology note Update Td today Update prevnar 20 today  Per pt colonosc was 2017 with 10 y recall  HIV status is stable Eye exam utd  pHQ score is 0 Encouraged good self care

## 2022-11-10 NOTE — Assessment & Plan Note (Signed)
A1C ordered  Microalb ordered   Continues Metformin xr 1000 mg daily  Glipizide xl 10 mg daily  Eye exam utd Normal foot exam  Follow up planned

## 2022-11-10 NOTE — Assessment & Plan Note (Signed)
Continues treatment for HIV  Doing well

## 2022-11-10 NOTE — Patient Instructions (Addendum)
Continue your current medicines   Add amlodipine 5 mg daily (for blood pressure)   Labs today   Follow up here in 2-4 weeks to review your labs further and re check blood pressure    Tetanus shot today  Prevnar 20 (pneumonia vaccine) today    After labs return I will send medicines to [facility]   Keep taking care of yourself  Try to get most of your carbohydrates from produce (with the exception of white potatoes)  Eat less bread/pasta/rice/snack foods/cereals/sweets and other items from the middle of the grocery store (processed carbs)  Keep exercising

## 2022-11-11 ENCOUNTER — Telehealth: Payer: Self-pay | Admitting: *Deleted

## 2022-11-11 ENCOUNTER — Other Ambulatory Visit: Payer: Self-pay

## 2022-11-11 NOTE — Telephone Encounter (Signed)
Left VM requesting pt to call the office back 

## 2022-11-11 NOTE — Telephone Encounter (Signed)
-----   Message from Judy Pimple, MD sent at 11/10/2022  5:00 PM EDT ----- A1c for diabetes is extremely high  Is he checking blood sugar levels?  (Please check bid and let us know if he needs equipment) Also potassium level is high- stop any supplements with potassium if you take any  Please follow up early next week  We need to re check potassium and also discuss diabetes

## 2022-11-21 NOTE — Telephone Encounter (Signed)
Left 3 VM and pt never called back, since it's the 3rd VM I left and pt has viewed results via mychart will close encounter. Pt does have f/u scheduled on 12/08/22 but never made earlier appt as PCP requested

## 2022-11-27 ENCOUNTER — Other Ambulatory Visit: Payer: Self-pay | Admitting: Family Medicine

## 2022-11-27 DIAGNOSIS — E119 Type 2 diabetes mellitus without complications: Secondary | ICD-10-CM

## 2022-11-27 DIAGNOSIS — I10 Essential (primary) hypertension: Secondary | ICD-10-CM

## 2022-12-06 ENCOUNTER — Other Ambulatory Visit (HOSPITAL_COMMUNITY): Payer: Self-pay

## 2022-12-07 ENCOUNTER — Other Ambulatory Visit (HOSPITAL_COMMUNITY): Payer: Self-pay

## 2022-12-08 ENCOUNTER — Ambulatory Visit: Payer: BC Managed Care – PPO | Admitting: Family Medicine

## 2023-01-04 ENCOUNTER — Other Ambulatory Visit (HOSPITAL_COMMUNITY): Payer: Self-pay

## 2023-01-05 ENCOUNTER — Other Ambulatory Visit: Payer: Self-pay | Admitting: Family Medicine

## 2023-01-05 DIAGNOSIS — E119 Type 2 diabetes mellitus without complications: Secondary | ICD-10-CM

## 2023-01-06 ENCOUNTER — Other Ambulatory Visit: Payer: Self-pay

## 2023-01-06 ENCOUNTER — Other Ambulatory Visit (HOSPITAL_COMMUNITY): Payer: Self-pay

## 2023-01-26 ENCOUNTER — Other Ambulatory Visit (HOSPITAL_COMMUNITY): Payer: Self-pay

## 2023-02-02 ENCOUNTER — Other Ambulatory Visit: Payer: Self-pay

## 2023-02-03 ENCOUNTER — Other Ambulatory Visit: Payer: Self-pay

## 2023-02-21 DIAGNOSIS — H04123 Dry eye syndrome of bilateral lacrimal glands: Secondary | ICD-10-CM | POA: Diagnosis not present

## 2023-02-21 DIAGNOSIS — H5203 Hypermetropia, bilateral: Secondary | ICD-10-CM | POA: Diagnosis not present

## 2023-02-21 DIAGNOSIS — H524 Presbyopia: Secondary | ICD-10-CM | POA: Diagnosis not present

## 2023-02-21 DIAGNOSIS — E119 Type 2 diabetes mellitus without complications: Secondary | ICD-10-CM | POA: Diagnosis not present

## 2023-02-21 DIAGNOSIS — H52223 Regular astigmatism, bilateral: Secondary | ICD-10-CM | POA: Diagnosis not present

## 2023-02-21 LAB — HM DIABETES EYE EXAM

## 2023-02-26 ENCOUNTER — Other Ambulatory Visit: Payer: Self-pay | Admitting: Family Medicine

## 2023-02-26 DIAGNOSIS — E119 Type 2 diabetes mellitus without complications: Secondary | ICD-10-CM

## 2023-02-26 DIAGNOSIS — I10 Essential (primary) hypertension: Secondary | ICD-10-CM

## 2023-02-28 NOTE — Telephone Encounter (Signed)
Lvmtcb, sent mychart message  

## 2023-02-28 NOTE — Telephone Encounter (Signed)
Don't see where patient followed up on blood pressure as requested. Ok to fill or do you want [place] to call for appointment

## 2023-02-28 NOTE — Telephone Encounter (Signed)
Please get appt scheduled then refill times one

## 2023-03-02 ENCOUNTER — Other Ambulatory Visit: Payer: Self-pay

## 2023-03-02 ENCOUNTER — Other Ambulatory Visit (HOSPITAL_COMMUNITY): Payer: Self-pay

## 2023-03-03 ENCOUNTER — Other Ambulatory Visit (HOSPITAL_COMMUNITY): Payer: Self-pay

## 2023-03-07 MED ORDER — GLIPIZIDE ER 10 MG PO TB24
10.0000 mg | ORAL_TABLET | Freq: Every day | ORAL | 0 refills | Status: DC
Start: 2023-03-07 — End: 2023-05-29

## 2023-03-07 MED ORDER — LOSARTAN POTASSIUM 100 MG PO TABS
100.0000 mg | ORAL_TABLET | Freq: Every day | ORAL | 0 refills | Status: DC
Start: 2023-03-07 — End: 2023-05-29

## 2023-03-07 NOTE — Addendum Note (Signed)
Addended by: Shon Millet on: 03/07/2023 11:07 AM   Modules accepted: Orders

## 2023-03-07 NOTE — Telephone Encounter (Signed)
Spoke to pt, scheduled f/u for 03/10/23

## 2023-03-07 NOTE — Telephone Encounter (Signed)
Med refilled once  

## 2023-03-10 ENCOUNTER — Ambulatory Visit (INDEPENDENT_AMBULATORY_CARE_PROVIDER_SITE_OTHER): Payer: BC Managed Care – PPO | Admitting: Family Medicine

## 2023-03-10 ENCOUNTER — Encounter: Payer: Self-pay | Admitting: Family Medicine

## 2023-03-10 VITALS — BP 132/80 | HR 63 | Temp 97.6°F | Ht 65.5 in | Wt 171.2 lb

## 2023-03-10 DIAGNOSIS — E1169 Type 2 diabetes mellitus with other specified complication: Secondary | ICD-10-CM | POA: Diagnosis not present

## 2023-03-10 DIAGNOSIS — E875 Hyperkalemia: Secondary | ICD-10-CM | POA: Insufficient documentation

## 2023-03-10 DIAGNOSIS — E119 Type 2 diabetes mellitus without complications: Secondary | ICD-10-CM

## 2023-03-10 DIAGNOSIS — I10 Essential (primary) hypertension: Secondary | ICD-10-CM

## 2023-03-10 DIAGNOSIS — E785 Hyperlipidemia, unspecified: Secondary | ICD-10-CM | POA: Diagnosis not present

## 2023-03-10 DIAGNOSIS — Z7984 Long term (current) use of oral hypoglycemic drugs: Secondary | ICD-10-CM

## 2023-03-10 DIAGNOSIS — Z23 Encounter for immunization: Secondary | ICD-10-CM | POA: Diagnosis not present

## 2023-03-10 LAB — BASIC METABOLIC PANEL
BUN: 19 mg/dL (ref 6–23)
CO2: 28 meq/L (ref 19–32)
Calcium: 9.9 mg/dL (ref 8.4–10.5)
Chloride: 102 meq/L (ref 96–112)
Creatinine, Ser: 1.1 mg/dL (ref 0.40–1.50)
GFR: 68.55 mL/min (ref >60.00)
Glucose, Bld: 129 mg/dL — ABNORMAL HIGH (ref 70–99)
Potassium: 4.4 meq/L (ref 3.5–5.1)
Sodium: 136 meq/L (ref 135–145)

## 2023-03-10 LAB — HEMOGLOBIN A1C: Hgb A1c MFr Bld: 8.5 % — ABNORMAL HIGH (ref 4.6–6.5)

## 2023-03-10 MED ORDER — METFORMIN HCL ER 500 MG PO TB24
500.0000 mg | ORAL_TABLET | Freq: Two times a day (BID) | ORAL | 1 refills | Status: DC
Start: 2023-03-10 — End: 2023-07-19

## 2023-03-10 MED ORDER — AMLODIPINE BESYLATE 5 MG PO TABS: 5.0000 mg | ORAL_TABLET | Freq: Every day | ORAL | 2 refills | Status: DC

## 2023-03-10 NOTE — Assessment & Plan Note (Signed)
BP: 132/80  Improved with addn of amlodpine 5 mg daily  Taking losartan 100 mg   K was high last time Lost to follow up  Re check today  If still high will need to change losartan   Encouraged healthy habits for HTN

## 2023-03-10 NOTE — Assessment & Plan Note (Signed)
Lab Results  Component Value Date   HGBA1C 11.2 (H) 11/10/2022  Was lost to follow up after last labs  Re checking today  Sounds like am sugar is high- much lower later in day Encouraged to stop sweet tea in evening  Eats most food 2-3 pm (may need to spread that out)  Otherwise fairly low glycemic diet  Will change metformin xr 500 to one pill bid for better coverage in am  Continue glipizide xl 10 mg (A1c pending)    Sent for eye exam / 2 wk ago Microalb utd Good foot care   Follow up based on lab and plan  Consider pharmacy ref also

## 2023-03-10 NOTE — Progress Notes (Unsigned)
Subjective:    Patient ID: Clinton Watts, male    DOB: 10-26-1955, 67 y.o.   MRN: [id number]  HPI  Wt Readings from Last 3 Encounters:  03/10/23 171 lb 4 oz (77.7 kg)  11/10/22 169 lb 4 oz (76.8 kg)  09/16/22 176 lb 9.6 oz (80.1 kg)   28.06 kg/m  Vitals:   03/10/23 0917  BP: 132/80  Pulse: 63  Temp: 97.6 F (36.4 C)  SpO2: 98%   Pt presents for follow up of chronic health problems including DM2 and HTN and lipids   He was lost to follow up after last visit and labs  Left 3 VM  Was supposed to follow up earlier   Per pt was traveling     HTN bp is stable today  No cp or palpitations or headaches or edema  No side effects to medicines  BP Readings from Last 3 Encounters:  03/10/23 132/80  11/10/22 (!) 178/78  09/16/22 (!) 161/77    Losartan 100 mg daily  Amlodipine 5 mg added last visit  Improved  No side effects    Lab Results  Component Value Date   NA 138 11/10/2022   K 5.4 (H) 11/10/2022   CO2 29 11/10/2022   GLUCOSE 186 (H) 11/10/2022   BUN 25 (H) 11/10/2022   CREATININE 1.03 11/10/2022   CALCIUM 10.2 11/10/2022   GFR 74.34 11/10/2022   EGFR 77 05/17/2022   GFRNONAA 70 09/23/2020   K was high  ? From losartan   Dm2 Lab Results  Component Value Date   HGBA1C 11.2 (H) 11/10/2022   Metformin xr 1000 mg daily - in the am  Glipizide xl 10 mg daily -in am   He is checking glucose levels   In the am/ fasting   190s  Later in the day 100 or below (when he returns from work)  58 - low one time   Eats most of food between 2 and 3 pm   He does drink tea that has sugar in it - every day (that is later)   Drinks enough water   No steroids  No infections   Eating has been ok Eats salmon/ veggies  Less starchy foods now   Exercise- a lot of walking  Lot of yard work   Had some right knee pain (not painful)  Got better on its own   Had eye exam 2 wk ago at [facility]  Per pt no dm eye disas  Lab Results  Component Value Date    MICROALBUR 0.9 11/10/2022       Hyperlipidemia Lab Results  Component Value Date   CHOL 138 11/10/2022   HDL 32.90 (L) 11/10/2022   LDLCALC 51 08/12/2019   LDLDIRECT 70.0 11/10/2022   TRIG 215.0 (H) 11/10/2022   CHOLHDL 4 11/10/2022   Crestor 20 mg daily     Patient Active Problem List   Diagnosis Date Noted   Routine general medical examination at a health care facility 11/10/2022   Infection of thumb 11/15/2021   Thumb pain, right 11/12/2021   Immunocompromised (HCC) 11/12/2021   Therapeutic drug monitoring 10/09/2020   Erectile dysfunction due to arterial insufficiency 07/30/2019   Hypogonadism in male 07/13/2019   Essential hypertension 01/22/2018   Human immunodeficiency virus (HIV) disease (HCC) 10/12/2017   Healthcare maintenance 10/12/2017   Erectile dysfunction 10/12/2017   History of hernia repair 08/26/2017   Type 2 diabetes mellitus without complication, without long-term current use of insulin (HCC) 08/26/2017  Hyperlipidemia associated with type 2 diabetes mellitus (HCC) 08/26/2017   Past Medical History:  Diagnosis Date   Diabetes mellitus without complication [facility])    ED (erectile dysfunction)    HIV (human immunodeficiency virus infection) (HCC) 10/12/2017   Hyperlipidemia    Past Surgical History:  Procedure Laterality Date   HERNIA REPAIR     Social History   Tobacco Use   Smoking status: Never   Smokeless tobacco: Never  Vaping Use   Vaping status: Never Used  Substance Use Topics   Alcohol use: No   Drug use: No   Family History  Problem Relation Age of Onset   Diabetes Mother    Stomach cancer Sister    Pancreatic cancer Brother    Prostate cancer Neg Hx    Bladder Cancer Neg Hx    Kidney cancer Neg Hx    No Known Allergies Current Outpatient Medications on File Prior to Visit  Medication Sig Dispense Refill   amLODipine (NORVASC) 5 MG tablet Take 1 tablet (5 mg total) by mouth daily. 30 tablet 1   dolutegravir-lamiVUDine  (DOVATO) 50-300 MG tablet Take 1 tablet by mouth daily. 90 tablet 3   glipiZIDE (GLUCOTROL XL) 10 MG 24 hr tablet Take 1 tablet (10 mg total) by mouth daily with breakfast. 90 tablet 0   losartan (COZAAR) 100 MG tablet Take 1 tablet (100 mg total) by mouth daily. 90 tablet 0   metFORMIN (GLUCOPHAGE-XR) 500 MG 24 hr tablet TAKE 2 TABLETS DAILY WITH  BREAKFAST 180 tablet 1   rosuvastatin (CRESTOR) 20 MG tablet TAKE 1 TABLET BY MOUTH EVERY DAY 90 tablet 0   sildenafil (REVATIO) 20 MG tablet TAKE 2-5 TABLETS BY MOUTH ONE HOUR BEFORE INTERCOURSE 90 tablet 3   sildenafil (VIAGRA) 100 MG tablet Take 1 tablet 1 hour prior to intercourse 10 tablet 0   tadalafil (CIALIS) 20 MG tablet 1 tablet 1 hour prior to intercourse 10 tablet 0   No current facility-administered medications on file prior to visit.    Review of Systems     Objective:   Physical Exam        Assessment & Plan:   Problem List Items Addressed This Visit   None

## 2023-03-10 NOTE — Patient Instructions (Addendum)
Consider changing tea to a sugar free options   Try to get most of your carbohydrates from produce (with the exception of white potatoes) and whole grains Eat less bread/pasta/rice/snack foods/cereals/sweets and other items from the middle of the grocery store (processed carbs)  Stay active Walk  Add some strength training to your routine, this is important for bone and brain health and can reduce your risk of falls and help your body use insulin properly and regulate weight  Light weights, exercise bands , and internet videos are a good way to start  Yoga (chair or regular), machines , floor exercises or a gym with machines are also good options     Divide your metformin xr 500 mg - start taking 1 in the am and one at night Keep watching blood sugars   If your glucose machine stops working or you find out it is inaccurate (ask pharmacist to test) then let us know   Labs today   Flu shot today

## 2023-03-10 NOTE — Assessment & Plan Note (Signed)
Lab Results  Component Value Date   K 5.4 (H) 11/10/2022  Was lost to follow up after this lab  ? If from losartan 100 mg  Re check today  May have to change arb

## 2023-03-10 NOTE — Assessment & Plan Note (Signed)
Disc goals for lipids and reasons to control them Rev last labs with pt Rev low sat fat diet in detail Last LDL 8=70   Labs ordered Continues crestor 20 mg daily  Tolerates this

## 2023-03-15 ENCOUNTER — Telehealth: Payer: Self-pay | Admitting: Family Medicine

## 2023-03-15 ENCOUNTER — Other Ambulatory Visit: Payer: Self-pay | Admitting: Urology

## 2023-03-15 NOTE — Telephone Encounter (Signed)
Pt called back returning call regarding results. Pt stated his Blood sugar levels have been doing better since the Metformin change. Pt stated he'd call back to scheduled 3 month f/u. Call back # [phone]

## 2023-03-24 ENCOUNTER — Other Ambulatory Visit: Payer: Self-pay | Admitting: Infectious Diseases

## 2023-03-24 ENCOUNTER — Other Ambulatory Visit: Payer: Self-pay

## 2023-03-24 ENCOUNTER — Other Ambulatory Visit (HOSPITAL_COMMUNITY): Payer: Self-pay

## 2023-03-24 DIAGNOSIS — B20 Human immunodeficiency virus [HIV] disease: Secondary | ICD-10-CM

## 2023-03-24 MED ORDER — DOVATO 50-300 MG PO TABS
1.0000 | ORAL_TABLET | Freq: Every day | ORAL | 0 refills | Status: DC
  Filled 2023-03-24: qty 30, 30d supply, fill #0

## 2023-03-31 ENCOUNTER — Encounter: Payer: Self-pay | Admitting: Infectious Diseases

## 2023-03-31 ENCOUNTER — Ambulatory Visit: Payer: BC Managed Care – PPO | Admitting: Infectious Diseases

## 2023-03-31 ENCOUNTER — Other Ambulatory Visit: Payer: Self-pay

## 2023-03-31 ENCOUNTER — Ambulatory Visit (INDEPENDENT_AMBULATORY_CARE_PROVIDER_SITE_OTHER): Payer: BC Managed Care – PPO | Admitting: Infectious Diseases

## 2023-03-31 VITALS — BP 147/75 | HR 75 | Temp 97.6°F | Ht 68.0 in | Wt 173.0 lb

## 2023-03-31 DIAGNOSIS — Z23 Encounter for immunization: Secondary | ICD-10-CM | POA: Diagnosis not present

## 2023-03-31 DIAGNOSIS — Z0000 Encounter for general adult medical examination without abnormal findings: Secondary | ICD-10-CM | POA: Diagnosis not present

## 2023-03-31 DIAGNOSIS — B20 Human immunodeficiency virus [HIV] disease: Secondary | ICD-10-CM | POA: Diagnosis not present

## 2023-03-31 MED ORDER — DOVATO 50-300 MG PO TABS
1.0000 | ORAL_TABLET | Freq: Every day | ORAL | 11 refills | Status: DC
  Filled 2023-03-31 – 2023-04-20 (×2): qty 30, 30d supply, fill #0
  Filled 2023-05-17: qty 30, 30d supply, fill #1
  Filled 2023-06-12: qty 30, 30d supply, fill #2
  Filled 2023-07-13: qty 30, 30d supply, fill #3
  Filled 2023-08-07: qty 30, 30d supply, fill #4
  Filled 2023-08-29: qty 30, 30d supply, fill #5
  Filled 2023-10-04: qty 30, 30d supply, fill #6
  Filled 2023-11-03: qty 30, 30d supply, fill #7
  Filled 2023-12-01: qty 30, 30d supply, fill #8
  Filled 2024-01-03: qty 30, 30d supply, fill #9
  Filled 2024-01-30: qty 30, 30d supply, fill #10
  Filled 2024-02-26: qty 30, 30d supply, fill #11

## 2023-03-31 NOTE — Patient Instructions (Addendum)
Will get you a covid vaccine today  Consider the RSV vaccine - information attached.   Will plan to see you back in 9 months (so we don't cause any trouble with medication refills for you).

## 2023-03-31 NOTE — Assessment & Plan Note (Signed)
Very well controlled on once daily Dovato. No concerns with access or adherence to medication. They are tolerating the medication well without side effects. No drug interactions identified. Pertinent lab tests ordered today.  No changes to insurance coverage.  No dental needs today.  No concern over anxious/depressed mood.  Sexual health and family planning discussed - no needs, does not need anal pap.  On statin for primary cardiac prevention d/t HIV and DM.  Vaccines updated today - see health maintenance section.   Return in about 9 months (around 12/29/2023).

## 2023-03-31 NOTE — Progress Notes (Signed)
Name: Anthone Vautour  HKV:[id number]   DOB: April 19, 1956   PCP: Judy Pimple, MD    Subjective   Clinton Watts is a 67 y.o. male HIV disease Dx 88. Originally from [place].  HIV Risk: heterosexual contact. Previously in care with [facility] in [place] [phone]).  Hx OIs: none known     Previous Regimens: Combivir + Crixivan Atripla --> well controlled and undetectable for years, switched for TAF regimen Biktarvy 10/2017 - suppressed Dovato 09/2021    Genotypes: None on file or from previous provider    HPI:  Clinton Watts is here for routine follow up and medication refills.  Doing well - dovato is easy to take and get. No concerns for s/e or access to medications.  No other changes in medications. Taking crestor as prescribed by PCP, diabetes is under better control.     Review of Systems  Constitutional:  Negative for appetite change, chills, fatigue, fever and unexpected weight change.  Eyes:  Negative for visual disturbance.  Respiratory:  Negative for cough and shortness of breath.   Cardiovascular:  Negative for chest pain and leg swelling.  Gastrointestinal:  Negative for abdominal pain, diarrhea and nausea.  Genitourinary:  Negative for dysuria, genital sores and penile discharge.  Musculoskeletal:  Negative for joint swelling.  Skin:  Negative for color change and rash.  Neurological:  Negative for dizziness and headaches.  Hematological:  Negative for adenopathy.  Psychiatric/Behavioral:  Negative for sleep disturbance. The patient is not nervous/anxious.      Objective   Today's Vitals   03/31/23 0844  BP: (!) 147/75  Pulse: 75  Temp: 97.6 F (36.4 C)  TempSrc: Oral  SpO2: 95%  Weight: 173 lb (78.5 kg)  Height: 5\' 8"  (1.727 m)  PainSc: 0-No pain   Body mass index is 26.3 kg/m.  Estimated body mass index is 26.3 kg/m as calculated from the following:   Height as of this encounter: 5\' 8"  (1.727 m).   Weight as of this encounter: 173 lb  (78.5 kg).      Physical Exam Vitals and nursing note reviewed.  Constitutional:      Appearance: Clinton Watts is well-developed.     Comments: Seated comfortably in chair during visit.   HENT:     Mouth/Throat:     Dentition: Normal dentition. No dental abscesses.  Cardiovascular:     Rate and Rhythm: Normal rate and regular rhythm.     Heart sounds: Normal heart sounds.  Pulmonary:     Effort: Pulmonary effort is normal.     Breath sounds: Normal breath sounds.  Abdominal:     General: There is no distension.     Palpations: Abdomen is soft.     Tenderness: There is no abdominal tenderness.  Lymphadenopathy:     Cervical: No cervical adenopathy.  Skin:    General: Skin is warm and dry.     Findings: No rash.  Neurological:     Mental Status: Clinton Watts is alert and oriented to person, place, and time.  Psychiatric:        Judgment: Judgment normal.     Comments: In good spirits today and engaged in care discussion.      LABS:  HIV 1 RNA Quant (Copies/mL)  Date Value  05/17/2022 Not Detected  08/13/2021 Not Detected  09/23/2020 <20 (H)   CD4 T Cell Abs (/uL)  Date Value  09/23/2020 404  08/12/2019 502  09/19/2018 500    Lab Results  Component  Value Date   CREATININE 1.10 03/10/2023   CREATININE 1.03 11/10/2022   CREATININE 1.05 05/17/2022    Lab Results  Component Value Date   WBC 6.5 11/10/2022   HGB 12.9 (L) 11/10/2022   HCT 41.4 11/10/2022   MCV 67.4 Repeated and verified X2. (L) 11/10/2022   PLT 219.0 11/10/2022    Lab Results  Component Value Date   ALT 10 11/10/2022   AST 21 11/10/2022   ALKPHOS 73 11/10/2022   BILITOT 0.5 11/10/2022     Assessment and Plan: Problem List Items Addressed This Visit       Unprioritized   Healthcare maintenance    COVID booster today Recommended RSV vaccine - information provided. Can schedule at community pharmacy       Human immunodeficiency virus (HIV) disease (HCC) - Primary    Very well controlled on once  daily Dovato. No concerns with access or adherence to medication. They are tolerating the medication well without side effects. No drug interactions identified. Pertinent lab tests ordered today.  No changes to insurance coverage.  No dental needs today.  No concern over anxious/depressed mood.  Sexual health and family planning discussed - no needs, does not need anal pap.  On statin for primary cardiac prevention d/t HIV and DM.  Vaccines updated today - see health maintenance section.   Return in about 9 months (around 12/29/2023).        Relevant Medications   dolutegravir-lamiVUDine (DOVATO) 50-300 MG tablet   Other Relevant Orders   HIV 1 RNA quant-no reflex-bld   T-helper cells (CD4) count   Other Visit Diagnoses     HIV disease (HCC)       Relevant Medications   dolutegravir-lamiVUDine (DOVATO) 50-300 MG tablet       Rexene Alberts, MSN, NP-C [facility]  [email or web address] Pager: [phone] Office: [phone] [facility] Main Line: [phone]

## 2023-03-31 NOTE — Assessment & Plan Note (Signed)
COVID booster today Recommended RSV vaccine - information provided. Can schedule at [facility] pharmacy

## 2023-04-03 LAB — HIV-1 RNA QUANT-NO REFLEX-BLD
HIV 1 RNA Quant: <20 {copies}/mL — ABNORMAL HIGH
HIV-1 RNA Quant, Log: <1.3 {Log} — ABNORMAL HIGH

## 2023-04-03 LAB — T-HELPER CELLS (CD4) COUNT (NOT AT ARMC)
Absolute CD4: 564 {cells}/uL (ref 490–1740)
CD4 T Helper %: 28 % — ABNORMAL LOW (ref 30–61)
Total lymphocyte count: 2018 {cells}/uL (ref 850–3900)

## 2023-04-18 ENCOUNTER — Other Ambulatory Visit: Payer: Self-pay | Admitting: Pharmacist

## 2023-04-18 NOTE — Progress Notes (Signed)
Specialty Pharmacy Ongoing Clinical Assessment Note  Clinton Watts is a 67 y.o. male who is being followed by the specialty pharmacy service for RxSp HIV   Patient's specialty medication(s) reviewed today: Dolutegravir-Lamivudine   Missed doses in the last 4 weeks: 0   Patient/Caregiver did not have any additional questions or concerns.   Therapeutic benefit summary: Patient is achieving benefit   Adverse events/side effects summary: No adverse events/side effects   Patient's therapy is appropriate to: Continue    Goals Addressed             This Visit's Progress    Achieve Undetectable HIV Viral Load < 20       Patient is on track. Patient will maintain adherence      Increase CD4 count until steady state       Patient is on track. Patient will maintain adherence         Follow up:  6 months  Jennette Kettle Specialty Pharmacist

## 2023-04-20 ENCOUNTER — Other Ambulatory Visit (HOSPITAL_COMMUNITY): Payer: Self-pay | Admitting: Pharmacy Technician

## 2023-04-20 ENCOUNTER — Other Ambulatory Visit (HOSPITAL_COMMUNITY): Payer: Self-pay

## 2023-04-20 NOTE — Progress Notes (Signed)
Specialty Pharmacy Refill Coordination Note  Clinton Watts is a 67 y.o. male contacted today regarding refills of specialty medication(s) Dolutegravir-Lamivudine   Patient requested Delivery   Delivery date: 04/25/23   Verified address: [address]  [place]   Medication will be filled on 04/24/23.

## 2023-05-17 ENCOUNTER — Other Ambulatory Visit: Payer: Self-pay

## 2023-05-17 NOTE — Progress Notes (Signed)
Specialty Pharmacy Refill Coordination Note  Clinton Watts is a 67 y.o. male contacted today regarding refills of specialty medication(s) Dolutegravir-Lamivudine   Patient requested Delivery   Delivery date: 05/22/23   Verified address: [address]   [place] [address]   Medication will be filled on 05/19/23.

## 2023-05-19 ENCOUNTER — Other Ambulatory Visit: Payer: Self-pay

## 2023-05-27 ENCOUNTER — Other Ambulatory Visit: Payer: Self-pay | Admitting: Family Medicine

## 2023-05-27 DIAGNOSIS — E119 Type 2 diabetes mellitus without complications: Secondary | ICD-10-CM

## 2023-05-27 DIAGNOSIS — I10 Essential (primary) hypertension: Secondary | ICD-10-CM

## 2023-06-12 ENCOUNTER — Other Ambulatory Visit (HOSPITAL_COMMUNITY): Payer: Self-pay

## 2023-06-12 ENCOUNTER — Other Ambulatory Visit: Payer: Self-pay

## 2023-06-12 NOTE — Progress Notes (Signed)
Specialty Pharmacy Refill Coordination Note  Clinton Watts is a 67 y.o. male contacted today regarding refills of specialty medication(s) Dolutegravir-Lamivudine   Patient requested Delivery   Delivery date: 06/21/23   Verified address: [address]   [place] [address]   Medication will be filled on 06/20/23.

## 2023-06-20 ENCOUNTER — Other Ambulatory Visit: Payer: Self-pay

## 2023-06-22 ENCOUNTER — Ambulatory Visit: Payer: BC Managed Care – PPO | Admitting: Family Medicine

## 2023-06-22 DIAGNOSIS — E875 Hyperkalemia: Secondary | ICD-10-CM

## 2023-06-22 DIAGNOSIS — I10 Essential (primary) hypertension: Secondary | ICD-10-CM

## 2023-06-22 DIAGNOSIS — B20 Human immunodeficiency virus [HIV] disease: Secondary | ICD-10-CM

## 2023-06-22 DIAGNOSIS — E785 Hyperlipidemia, unspecified: Secondary | ICD-10-CM

## 2023-06-22 DIAGNOSIS — E119 Type 2 diabetes mellitus without complications: Secondary | ICD-10-CM

## 2023-06-29 ENCOUNTER — Other Ambulatory Visit: Payer: Self-pay

## 2023-07-13 ENCOUNTER — Other Ambulatory Visit (HOSPITAL_COMMUNITY): Payer: Self-pay

## 2023-07-13 ENCOUNTER — Other Ambulatory Visit (HOSPITAL_COMMUNITY): Payer: Self-pay | Admitting: Pharmacy Technician

## 2023-07-13 NOTE — Progress Notes (Signed)
 Specialty Pharmacy Refill Coordination Note  Clinton Watts is a 68 y.o. male contacted today regarding refills of specialty medication(s) Dolutegravir -lamiVUDine  (Dovato )   Patient requested Delivery   Delivery date: 07/19/23   Verified address: Patient address [address]  [place]   Medication will be filled on 07/18/23.

## 2023-07-19 ENCOUNTER — Telehealth: Payer: Self-pay | Admitting: Family Medicine

## 2023-07-19 ENCOUNTER — Encounter: Payer: Self-pay | Admitting: Family Medicine

## 2023-07-19 ENCOUNTER — Ambulatory Visit (INDEPENDENT_AMBULATORY_CARE_PROVIDER_SITE_OTHER): Payer: BC Managed Care – PPO | Admitting: Family Medicine

## 2023-07-19 VITALS — BP 135/75 | HR 81 | Temp 98.2°F | Ht 68.0 in | Wt 174.0 lb

## 2023-07-19 DIAGNOSIS — E119 Type 2 diabetes mellitus without complications: Secondary | ICD-10-CM

## 2023-07-19 DIAGNOSIS — E1169 Type 2 diabetes mellitus with other specified complication: Secondary | ICD-10-CM | POA: Diagnosis not present

## 2023-07-19 DIAGNOSIS — E875 Hyperkalemia: Secondary | ICD-10-CM

## 2023-07-19 DIAGNOSIS — B20 Human immunodeficiency virus [HIV] disease: Secondary | ICD-10-CM

## 2023-07-19 DIAGNOSIS — I10 Essential (primary) hypertension: Secondary | ICD-10-CM | POA: Diagnosis not present

## 2023-07-19 DIAGNOSIS — E785 Hyperlipidemia, unspecified: Secondary | ICD-10-CM

## 2023-07-19 DIAGNOSIS — Z7985 Long-term (current) use of injectable non-insulin antidiabetic drugs: Secondary | ICD-10-CM

## 2023-07-19 DIAGNOSIS — Z7984 Long term (current) use of oral hypoglycemic drugs: Secondary | ICD-10-CM | POA: Diagnosis not present

## 2023-07-19 LAB — POCT GLYCOSYLATED HEMOGLOBIN (HGB A1C): Hemoglobin A1C: 8.8 % — AB (ref 4.0–5.6)

## 2023-07-19 MED ORDER — METFORMIN HCL ER 500 MG PO TB24: ORAL_TABLET | ORAL | 1 refills | Status: DC

## 2023-07-19 MED ORDER — OZEMPIC (0.25 OR 0.5 MG/DOSE) 2 MG/3ML ~~LOC~~ SOPN: 0.2500 mg | PEN_INJECTOR | SUBCUTANEOUS | 0 refills | Status: DC

## 2023-07-19 NOTE — Telephone Encounter (Signed)
 Copied from CRM [id number]. Topic: Referral - Question >> Jul 19, 2023 11:29 AM Yolande Hench C wrote: Reason for CRM: Ethelle Herb with the referred eye center, called to inform the provider, the patient is not due to be seen until August 2025. Referral#[id number] Please follow up with referred provider for any questions.

## 2023-07-19 NOTE — Progress Notes (Signed)
 Subjective:    Patient ID: Clinton Watts, male    DOB: 02-17-1956, 68 y.o.   MRN: [id number]  HPI  Wt Readings from Last 3 Encounters:  07/19/23 174 lb (78.9 kg)  03/31/23 173 lb (78.5 kg)  03/10/23 171 lb 4 oz (77.7 kg)   26.46 kg/m  Vitals:   07/19/23 1007 07/19/23 1031  BP: (!) 154/86 135/75  Pulse: 81   Temp: 98.2 F (36.8 C)   SpO2: 98%      Pt presents for follow up of DM2 and chronic medical problems    HTN bp is stable today  No cp or palpitations or headaches or edema  No side effects to medicines  BP Readings from Last 3 Encounters:  07/19/23 135/75  03/31/23 (!) 147/75  03/10/23 132/80    Amlodipine  5 mg daily  Losartan  100 mg   At home 130s/65 usually     Lab Results  Component Value Date   NA 136 03/10/2023   K 4.4 03/10/2023   CO2 28 03/10/2023   GLUCOSE 129 (H) 03/10/2023   BUN 19 03/10/2023   CREATININE 1.10 03/10/2023   CALCIUM  9.9 03/10/2023   GFR 68.55 03/10/2023   EGFR 77 05/17/2022   GFRNONAA 70 09/23/2020   DM2 Lab Results  Component Value Date   HGBA1C 8.8 (A) 07/19/2023   HGBA1C 8.5 (H) 03/10/2023   HGBA1C 11.2 (H) 11/10/2022    Metformin  xr 500 mg bid  Glipizide  xl 10 mg daily - splitting and taking twice daily   Blood sugars are too high  Watching diet   In am- 190s before he eats Later in day/evenings usually 120s-160s   Lot of walking- at least 30-60  Goal is to get to gym   Eye exam- just had that done in fall   Lab Results  Component Value Date   MICROALBUR 0.9 11/10/2022     Hyperlipidemia Lab Results  Component Value Date   CHOL 138 11/10/2022   HDL 32.90 (L) 11/10/2022   LDLCALC 51 08/12/2019   LDLDIRECT 70.0 11/10/2022   TRIG 215.0 (H) 11/10/2022   CHOLHDL 4 11/10/2022   Crestor  20 mg daily     Patient Active Problem List   Diagnosis Date Noted   Hyperkalemia 03/10/2023   Routine general medical examination at a health care facility 11/10/2022   Thumb pain, right 11/12/2021    Therapeutic drug monitoring 10/09/2020   Erectile dysfunction due to arterial insufficiency 07/30/2019   Hypogonadism in male 07/13/2019   Essential hypertension 01/22/2018   Human immunodeficiency virus (HIV) disease (HCC) 10/12/2017   Healthcare maintenance 10/12/2017   Erectile dysfunction 10/12/2017   History of hernia repair 08/26/2017   Diabetes mellitus treated with oral medication (HCC) 08/26/2017   Hyperlipidemia associated with type 2 diabetes mellitus (HCC) 08/26/2017   Past Medical History:  Diagnosis Date   Diabetes mellitus without complication (HCC)    ED (erectile dysfunction)    HIV (human immunodeficiency virus infection) (HCC) 10/12/2017   Hyperlipidemia    Past Surgical History:  Procedure Laterality Date   HERNIA REPAIR     Social History   Tobacco Use   Smoking status: Never   Smokeless tobacco: Never  Vaping Use   Vaping status: Never Used  Substance Use Topics   Alcohol use: No   Drug use: No   Family History  Problem Relation Age of Onset   Diabetes Mother    Stomach cancer Sister    Pancreatic cancer Brother  Prostate cancer Neg Hx    Bladder Cancer Neg Hx    Kidney cancer Neg Hx    No Known Allergies Current Outpatient Medications on File Prior to Visit  Medication Sig Dispense Refill   amLODipine  (NORVASC ) 5 MG tablet Take 1 tablet (5 mg total) by mouth daily. 90 tablet 2   dolutegravir -lamiVUDine  (DOVATO ) 50-300 MG tablet Take 1 tablet by mouth daily. 30 tablet 11   glipiZIDE  (GLUCOTROL  XL) 10 MG 24 hr tablet TAKE 1 TABLET DAILY WITH   BREAKFAST 90 tablet 0   losartan  (COZAAR ) 100 MG tablet TAKE 1 TABLET DAILY 90 tablet 0   rosuvastatin  (CRESTOR ) 20 MG tablet TAKE 1 TABLET BY MOUTH EVERY DAY 90 tablet 0   sildenafil  (VIAGRA ) 100 MG tablet TAKE 1 TABLET BY MOUTH 1 HOUR PRIOR TO INTERCOURSE 10 tablet 0   No current facility-administered medications on file prior to visit.    Review of Systems  Constitutional:  Negative for activity  change, appetite change, fatigue, fever and unexpected weight change.  HENT:  Negative for congestion, rhinorrhea, sore throat and trouble swallowing.   Eyes:  Negative for pain, redness, itching and visual disturbance.  Respiratory:  Negative for cough, chest tightness, shortness of breath and wheezing.   Cardiovascular:  Negative for chest pain and palpitations.  Gastrointestinal:  Negative for abdominal pain, blood in stool, constipation, diarrhea and nausea.  Endocrine: Negative for cold intolerance, heat intolerance, polydipsia and polyuria.  Genitourinary:  Negative for difficulty urinating, dysuria, frequency and urgency.  Musculoskeletal:  Negative for arthralgias, joint swelling and myalgias.  Skin:  Negative for pallor and rash.  Neurological:  Negative for dizziness, tremors, weakness, numbness and headaches.  Hematological:  Negative for adenopathy. Does not bruise/bleed easily.  Psychiatric/Behavioral:  Negative for decreased concentration and dysphoric mood. The patient is not nervous/anxious.        Objective:   Physical Exam Constitutional:      General: He is not in acute distress.    Appearance: Normal appearance. He is well-developed and normal weight. He is not ill-appearing or diaphoretic.  HENT:     Head: Normocephalic and atraumatic.  Eyes:     Conjunctiva/sclera: Conjunctivae normal.     Pupils: Pupils are equal, round, and reactive to light.  Neck:     Thyroid: No thyromegaly.     Vascular: No carotid bruit or JVD.  Cardiovascular:     Rate and Rhythm: Normal rate and regular rhythm.     Heart sounds: Normal heart sounds.     No gallop.  Pulmonary:     Effort: Pulmonary effort is normal. No respiratory distress.     Breath sounds: Normal breath sounds. No wheezing or rales.  Abdominal:     General: There is no distension or abdominal bruit.     Palpations: Abdomen is soft.  Musculoskeletal:     Cervical back: Normal range of motion and neck supple.      Right lower leg: No edema.     Left lower leg: No edema.  Lymphadenopathy:     Cervical: No cervical adenopathy.  Skin:    General: Skin is warm and dry.     Coloration: Skin is not pale.     Findings: No rash.  Neurological:     Mental Status: He is alert.     Coordination: Coordination normal.     Deep Tendon Reflexes: Reflexes are normal and symmetric. Reflexes normal.  Psychiatric:        Mood and  Affect: Mood normal.           Assessment & Plan:   Problem List Items Addressed This Visit       Cardiovascular and Mediastinum   Essential hypertension   bp in fair control at this time  BP Readings from Last 1 Encounters:  07/19/23 135/75  At home slightly lower No changes needed Most recent labs reviewed  Disc lifstyle change with low sodium diet and exercise   Can consider thiazide diuretic in future if needed  K is normalized now on losartan    Continues Losartan  100 mg daily  Amlodipine  5 mg daily            Endocrine   Hyperlipidemia associated with type 2 diabetes mellitus (HCC)   Disc goals for lipids and reasons to control them Rev last labs with pt Rev low sat fat diet in detail Last LDL 70   Labs ordered Continues crestor  20 mg daily  Tolerates this       Relevant Medications   metFORMIN  (GLUCOPHAGE -XR) 500 MG 24 hr tablet   Semaglutide ,0.25 or 0.5MG /DOS, (OZEMPIC , 0.25 OR 0.5 MG/DOSE,) 2 MG/3ML SOPN   Diabetes mellitus treated with oral medication (HCC) - Primary   Lab Results  Component Value Date   HGBA1C 8.8 (A) 07/19/2023   HGBA1C 8.5 (H) 03/10/2023   HGBA1C 11.2 (H) 11/10/2022   This is not well controlled Am glucose is higher than pm Good exercise /encouraged to add some strength building activity  Will change metformin  xr to 500 in am and 1000 in pm  Sent prescription for semaglutide  0.25 injection weekly  Disc option of GLP medication including possible side effects like GI intolerance and risk of thyroid and endocrine  cancer, pancreatitis and gallstones, kidney problems and diabetic retinopathy Will see if this is covered and pt will call us  when he gets us   If he starts this / will hold glipizide   Also consider CGM later if insurance will cover  Good habits overall  Sent for eye exam from the fall at [facility]   Will refer to pharmacy as well for help with cm   On statin with LDL of 70  Follow up will be planned based on status of glp      Relevant Medications   metFORMIN  (GLUCOPHAGE -XR) 500 MG 24 hr tablet   Semaglutide ,0.25 or 0.5MG /DOS, (OZEMPIC , 0.25 OR 0.5 MG/DOSE,) 2 MG/3ML SOPN     Other   Hyperkalemia   Resolved Lab Results  Component Value Date   K 4.4 03/10/2023   Continues losartan  100 mg daily       Other Visit Diagnoses       Type 2 diabetes mellitus without complication, without long-term current use of insulin (HCC)       Relevant Medications   metFORMIN  (GLUCOPHAGE -XR) 500 MG 24 hr tablet   Semaglutide ,0.25 or 0.5MG /DOS, (OZEMPIC , 0.25 OR 0.5 MG/DOSE,) 2 MG/3ML SOPN   Other Relevant Orders   POCT HgB A1C (Completed)   AMB Referral [facility] Care Management

## 2023-07-19 NOTE — Assessment & Plan Note (Signed)
 Lab Results  Component Value Date   HGBA1C 8.8 (A) 07/19/2023   HGBA1C 8.5 (H) 03/10/2023   HGBA1C 11.2 (H) 11/10/2022   This is not well controlled Am glucose is higher than pm Good exercise /encouraged to add some strength building activity  Will change metformin  xr to 500 in am and 1000 in pm  Sent prescription for semaglutide  0.25 injection weekly  Disc option of GLP medication including possible side effects like GI intolerance and risk of thyroid and endocrine cancer, pancreatitis and gallstones, kidney problems and diabetic retinopathy Will see if this is covered and pt will call us  when he gets us   If he starts this / will hold glipizide   Also consider CGM later if insurance will cover  Good habits overall  Sent for eye exam from the fall at [facility]   Will refer to pharmacy as well for help with cm   On statin with LDL of 70  Follow up will be planned based on status of glp

## 2023-07-19 NOTE — Telephone Encounter (Signed)
 Called [facility] and requested they send last years eye exam since pt isn't due till Aug. They are sending it now

## 2023-07-19 NOTE — Assessment & Plan Note (Addendum)
 bp in fair control at this time  BP Readings from Last 1 Encounters:  07/19/23 135/75  At home slightly lower No changes needed Most recent labs reviewed  Disc lifstyle change with low sodium diet and exercise   Can consider thiazide diuretic in future if needed  K is normalized now on losartan    Continues Losartan  100 mg daily  Amlodipine  5 mg daily

## 2023-07-19 NOTE — Assessment & Plan Note (Signed)
 Resolved Lab Results  Component Value Date   K 4.4 03/10/2023   Continues losartan  100 mg daily

## 2023-07-19 NOTE — Assessment & Plan Note (Signed)
 Disc goals for lipids and reasons to control them Rev last labs with pt Rev low sat fat diet in detail Last LDL 70   Labs ordered Continues crestor  20 mg daily  Tolerates this

## 2023-07-19 NOTE — Patient Instructions (Addendum)
 Keep walking  Add some strength training to your routine, this is important for bone and brain health and can reduce your risk of falls and help your body use insulin properly and regulate weight  Light weights, exercise bands , and internet videos are a good way to start  Yoga (chair or regular), machines , floor exercises or a gym with machines are also good options    Try to get most of your carbohydrates from produce (with the exception of white potatoes) and whole grains Eat less bread/pasta/rice/snack foods/cereals/sweets and other items from the middle of the grocery store (processed carbs)  I want to try a GLP-1 injectable medicine for diabetes  We will start with generic ozempic  to see if it is covered (or whichever brand is covered)  Let us  know when you get it before you start it  We will go up on the dose monthly   Go up on metformin  to 1 pill in am and 2 in the pm   If and when you start the glp-1 medicine- hold your glipizide     I will also put pharmacy referral in / also very helpful    Keep checking your glucose as well   More than likely we will see you in about a month depending on if the medicine gets covered

## 2023-07-20 ENCOUNTER — Other Ambulatory Visit: Payer: Self-pay | Admitting: Urology

## 2023-07-25 ENCOUNTER — Telehealth: Payer: Self-pay

## 2023-07-25 NOTE — Progress Notes (Signed)
Care Guide Pharmacy Note  07/25/2023 Name: [name] MRN: [id number] DOB: [birth year]  Referred By: Judy Pimple, MD Reason for referral: Care Coordination (Outreach to schedule with Pharm d )   [name] is a 68 y.o. year old male who is a primary care patient of Tower, Audrie Gallus, MD.  Kwinton Schoepp was referred to the pharmacist for assistance related to: DMII  An unsuccessful telephone outreach was attempted today to contact the patient who was referred to the pharmacy team for assistance with medication assistance. Additional attempts will be made to contact the patient.  Penne Lash , RMA     [facility]  [facility], [facility] Guide  Direct Dial: [phone]  Website: [email or web address]

## 2023-08-01 ENCOUNTER — Telehealth: Payer: Self-pay

## 2023-08-01 NOTE — Progress Notes (Signed)
Care Guide Pharmacy Note  08/01/2023 Name: Clinton Watts MRN: [id number] DOB: 18-Jan-1956  Referred By: Judy Pimple, MD Reason for referral: Care Coordination (Outreach to schedule with Pharm d )   Clinton Watts is a 68 y.o. year old male who is a primary care patient of Tower, Audrie Gallus, MD.  Clinton Watts was referred to the pharmacist for assistance related to: DMII  Successful contact was made with the patient to discuss pharmacy services.  Patient declines engagement at this time. Contact information was provided to the patient should they wish to reach out for assistance at a later time.  Penne Lash , RMA     [facility]  [facility], [facility] Guide  Direct Dial: [phone]  Website: [email or web address]

## 2023-08-01 NOTE — Telephone Encounter (Signed)
Any chance pharmacy services could see if there is help to be had with expense ? Or see if different brand is covered? I doubt metformin by itself would get him to goal?   Keep working on low glycemic diet as well

## 2023-08-01 NOTE — Progress Notes (Signed)
Care Guide Pharmacy Note  08/01/2023 Name: Clinton Watts MRN: [id number] DOB: [birth year]  Referred By: Judy Pimple, MD Reason for referral: Care Coordination ([facility] Diabetes. )   Clinton Watts is a 68 y.o. year old male who is a primary care patient of Tower, Audrie Gallus, MD.  Clinton Watts was referred to the pharmacist for assistance related to: DMII  Successful contact was made with the patient to discuss pharmacy services.  Patient declines engagement at this time. Contact information was provided to the patient should they wish to reach out for assistance at a later time.  [facility]  [facility] Health Care Management Assistant Direct Dial: [phone]  Fax: [phone]

## 2023-08-01 NOTE — Progress Notes (Signed)
Care Guide Pharmacy Note  08/01/2023 Name: Tanya Marvin MRN: [id number] DOB: 06-05-1956  Referred By: Judy Pimple, MD Reason for referral: Care Coordination ([facility] Diabetes. )   Ermine Spofford is a 68 y.o. year old male who is a primary care patient of Tower, Audrie Gallus, MD.  Jasmon Graffam was referred to the pharmacist for assistance related to: DMII  An unsuccessful telephone outreach was attempted today to contact the patient who was referred to the pharmacy team for assistance with diabetes. Additional attempts will be made to contact the patient.  [facility]  [facility], [facility] Health Care Management Assistant Direct Dial: [phone]  Fax: [phone]

## 2023-08-01 NOTE — Telephone Encounter (Signed)
Copied from CRM [id number]. Topic: Clinical - Prescription Issue >> Aug 01, 2023 10:30 AM Florestine Avers wrote: Reason for CRM: Patient wants to stick with metformin states that the injection pen cost too much.

## 2023-08-02 ENCOUNTER — Encounter: Payer: Self-pay | Admitting: Pharmacist

## 2023-08-07 ENCOUNTER — Other Ambulatory Visit (HOSPITAL_COMMUNITY): Payer: Self-pay

## 2023-08-07 ENCOUNTER — Other Ambulatory Visit: Payer: Self-pay

## 2023-08-07 ENCOUNTER — Other Ambulatory Visit (HOSPITAL_COMMUNITY): Payer: Self-pay | Admitting: Pharmacy Technician

## 2023-08-07 NOTE — Progress Notes (Signed)
Specialty Pharmacy Refill Coordination Note  Clinton Watts is a 68 y.o. male contacted today regarding refills of specialty medication(s) Dolutegravir-lamiVUDine (Dovato)   Patient requested Delivery   Delivery date: 08/11/23   Verified address: Patient address [address]  [place]   Medication will be filled on 08/10/23.

## 2023-08-15 ENCOUNTER — Other Ambulatory Visit: Payer: Self-pay | Admitting: Family Medicine

## 2023-08-15 DIAGNOSIS — I10 Essential (primary) hypertension: Secondary | ICD-10-CM

## 2023-08-15 DIAGNOSIS — E119 Type 2 diabetes mellitus without complications: Secondary | ICD-10-CM

## 2023-08-24 ENCOUNTER — Telehealth: Payer: Self-pay

## 2023-08-24 NOTE — Telephone Encounter (Signed)
Patient was identified as falling into the [place] Measure - Diabetes.   Patient was: Appointment scheduled for lab or office visit for A1c. Pt had an appointment on 07/19/23 with Dr. Milinda Antis, A1C completed and new orders. No further intervention at this time.

## 2023-08-29 ENCOUNTER — Other Ambulatory Visit: Payer: Self-pay

## 2023-08-29 ENCOUNTER — Other Ambulatory Visit (HOSPITAL_COMMUNITY): Payer: Self-pay

## 2023-08-29 NOTE — Progress Notes (Signed)
 Specialty Pharmacy Refill Coordination Note  Clinton Watts is a 68 y.o. male contacted today regarding refills of specialty medication(s) Dolutegravir-lamiVUDine (Dovato)   Patient requested Delivery   Delivery date: 09/12/23   Verified address: Patient address [address]  [place]   Medication will be filled on 03.10.25.

## 2023-09-07 ENCOUNTER — Telehealth: Payer: Self-pay | Admitting: Urology

## 2023-09-07 NOTE — Telephone Encounter (Signed)
 Yes he can be scheduled for psa prior

## 2023-09-07 NOTE — Telephone Encounter (Signed)
 Patient called and requested to schedule a one year follow up appointment with Dr. Lonna Cobb. He asked if he needs PSA done prior. Please advise if lab appt needs to be scheduled.

## 2023-09-15 ENCOUNTER — Encounter: Payer: Self-pay | Admitting: Pharmacist

## 2023-09-15 NOTE — Telephone Encounter (Signed)
 Patient was identified as falling into the [place] Measure - Diabetes.   Patient was: Appointment scheduled with primary care provider in the next 30 days.

## 2023-09-15 NOTE — Progress Notes (Signed)
 Chart Review:  Date of review: 09/15/23 Patient was identified as falling into the True [facility] for Diabetes control.     Last A1c: 8.8% (07/19/23) Next A1c Due: 10/17/23 Next Scheduled Visit: None  Last PCP visit, Ozempic sent to pharmacy for coverage determination, No Ozempic fill hx reported.   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 Result: Medication is covered without prior auth   TEST CLAIM  Ozempic = $190/mo (with savings card -$100 = ~$90/month) Rybelsus = $14/month (with savings card $10/month) Mounjaro = $193/mo (with savings card -$150 = ~$43/month) Trulicity = ~$188/mo ((with savings card -$150 = ~$38/month) Jardiance = ~$80 (with savings card ~$10/month); Marcelline Deist $0/month   Pharmacy Recommendations:  Greggory Keen ~$43 with copay card Rybelsus = $10 with copay card Jardiance = $10 with copay card  MyChart Message sent to patient regarding possible cheaper alternatives to [facility]  Future Appointments  Date Time Provider Department Center  10/03/2023 11:30 AM [facility] None  10/06/2023 12:30 PM Stoioff, Verna Czech, MD [facility] None

## 2023-09-15 NOTE — Progress Notes (Signed)
 Great, thanks  I hope he goes for the [facility]   Let me know what you end up needing from me

## 2023-09-22 ENCOUNTER — Other Ambulatory Visit (INDEPENDENT_AMBULATORY_CARE_PROVIDER_SITE_OTHER): Payer: Self-pay | Admitting: Pharmacist

## 2023-09-22 ENCOUNTER — Other Ambulatory Visit: Payer: Self-pay

## 2023-09-22 DIAGNOSIS — E119 Type 2 diabetes mellitus without complications: Secondary | ICD-10-CM

## 2023-09-22 NOTE — Progress Notes (Signed)
 Thantks-would like to go with farxiga, before I send it in- does he have the copay card ? If not, how do I apply it ? Thanks

## 2023-09-22 NOTE — Progress Notes (Signed)
 Brief Telephone Documentation Reason for Call: Patient has been identified as being included in [place] A1c measure. Sent patient a MyChart message last week which has not been read at this time.   A1c is 8.8%, prescribed metformin and Ozempic. Upon chart review, appears that patient never filled Ozempic at the pharmacy, likely due to cost.   Summary of Call: Called patient 3/21 - successful contact  Patient confirms that he never started Ozempic due to a high copay. Discussed what a feasible copay would be each month, he states zero dollars.   Reported Regimen: Metformin XR 500 mg am, 1000 mg pm Glipizide XL 10 mg daily   Assessment: GLP1 Unfortunately, injectable GLP1s at this time are not preferable to patient, even at reduced copay-card prices.  Reviewed oral GLP1 (Rybelsus) with patient with estimated $10/month cost with insurance card. He does report preference for medicine that is $0/month which is the cost of metformin currently.  SGLT2i Farxiga copay card should bring his copay down to $0 according to the website. SGLT2i is a reasonable option, though expect A1c-lowering of ~1 point.  Last UACR Screening: 11/10/22 Urine creatinine: 108.4 mg/dL Urine microalbumin: 0.9 mg/dL UACR: 8.3 mg/g - Normal <30 DPP4i Non-formulary/PA required Tradjenta copay card maximum benefit = as low as $10/month Januvia: Copay card $5/mo. Not covered, generic preferred.  Generic = "Zituvio". Test claim = $60/mo    Pharmacy Recommendations: GLP1 ideal in terms of glycemic reduction potential, though patient reports inability to afford copays at this time  Consider metformin titration to goal dose 1000 mg BID Consider initiation of Farxiga per patient preference Farxiga 5 mg daily x4weeks, then 10 mg daily if needed for additional A1c-reduction BIN# [id number] PCN# [id number] GRP# [id number] ID# [id number]  Follow Up: Patient given direct line for further questions/concerns.  Loree Fee,  PharmD Clinical Pharmacist [facility] [phone]

## 2023-09-24 ENCOUNTER — Other Ambulatory Visit: Payer: Self-pay | Admitting: Family Medicine

## 2023-09-24 NOTE — Telephone Encounter (Signed)
 I have pended a prescription for farxiga with copay card info to start for diabetes What pharmacy would he like this sent to ? Plan is to try it for 4 weeks at 5 mg dosage then go up to 10 mg   Let us know if any issues or if copay card does not work   Please follow up in about a month (earlier if needed)

## 2023-09-28 ENCOUNTER — Other Ambulatory Visit: Payer: Self-pay | Admitting: Pharmacist

## 2023-09-28 DIAGNOSIS — E119 Type 2 diabetes mellitus without complications: Secondary | ICD-10-CM

## 2023-09-28 MED ORDER — DAPAGLIFLOZIN PROPANEDIOL 5 MG PO TABS: 5.0000 mg | ORAL_TABLET | Freq: Every day | ORAL | 1 refills | Status: DC

## 2023-10-02 NOTE — Telephone Encounter (Signed)
 Left VM requesting pt to call the office back

## 2023-10-03 ENCOUNTER — Other Ambulatory Visit: Payer: Self-pay

## 2023-10-03 ENCOUNTER — Other Ambulatory Visit

## 2023-10-03 DIAGNOSIS — Z125 Encounter for screening for malignant neoplasm of prostate: Secondary | ICD-10-CM

## 2023-10-03 MED ORDER — DAPAGLIFLOZIN PROPANEDIOL 5 MG PO TABS: 5.0000 mg | ORAL_TABLET | Freq: Every day | ORAL | 0 refills | Status: DC

## 2023-10-03 NOTE — Telephone Encounter (Signed)
 Pt notified Rx sent ([facility] per pt), pt said he will wait to schedule f/u to make sure he can get med. Pt advised if there is any issues to let me or Lillia Abed (Pharmacist know)

## 2023-10-04 ENCOUNTER — Other Ambulatory Visit: Payer: Self-pay

## 2023-10-04 LAB — PSA: Prostate Specific Ag, Serum: 0.9 ng/mL (ref 0.0–4.0)

## 2023-10-04 NOTE — Progress Notes (Signed)
 Specialty Pharmacy Refill Coordination Note  Rochelle Nephew is a 68 y.o. male contacted today regarding refills of specialty medication(s) Dolutegravir-lamiVUDine (Dovato)   Patient requested Delivery   Delivery date: 10/12/23   Verified address: Patient address [address]  [place]   Medication will be filled on 10/11/23.

## 2023-10-05 ENCOUNTER — Other Ambulatory Visit (HOSPITAL_COMMUNITY): Payer: Self-pay

## 2023-10-05 ENCOUNTER — Other Ambulatory Visit: Payer: Self-pay

## 2023-10-05 NOTE — Progress Notes (Signed)
 Specialty Pharmacy Ongoing Clinical Assessment Note  Clinton Watts is a 68 y.o. male who is being followed by the specialty pharmacy service for RxSp HIV   Patient's specialty medication(s) reviewed today: Dolutegravir-lamiVUDine (Dovato)   Missed doses in the last 4 weeks: 0   Patient/Caregiver did not have any additional questions or concerns.   Therapeutic benefit summary: Patient is achieving benefit   Adverse events/side effects summary: No adverse events/side effects   Patient's therapy is appropriate to: Continue    Goals Addressed             This Visit's Progress    Achieve Undetectable HIV Viral Load < 20   On track    Patient is on track. Patient will maintain adherence      Increase CD4 count until steady state   On track    Patient is on track. Patient will maintain adherence         Follow up:  6 months  Otto Herb Specialty Pharmacist

## 2023-10-06 ENCOUNTER — Encounter: Payer: Self-pay | Admitting: Urology

## 2023-10-06 ENCOUNTER — Ambulatory Visit (INDEPENDENT_AMBULATORY_CARE_PROVIDER_SITE_OTHER): Admitting: Urology

## 2023-10-06 VITALS — BP 125/74 | HR 77

## 2023-10-06 DIAGNOSIS — N5201 Erectile dysfunction due to arterial insufficiency: Secondary | ICD-10-CM | POA: Diagnosis not present

## 2023-10-06 DIAGNOSIS — Z125 Encounter for screening for malignant neoplasm of prostate: Secondary | ICD-10-CM | POA: Diagnosis not present

## 2023-10-06 DIAGNOSIS — N529 Male erectile dysfunction, unspecified: Secondary | ICD-10-CM

## 2023-10-06 MED ORDER — TADALAFIL 20 MG PO TABS: ORAL_TABLET | ORAL | 0 refills | Status: DC

## 2023-10-06 MED ORDER — TADALAFIL 5 MG PO TABS: 5.0000 mg | ORAL_TABLET | Freq: Every day | ORAL | 0 refills | Status: DC

## 2023-10-06 NOTE — Progress Notes (Signed)
 I, Maysun Anabel Bene, acting as a scribe for [name], MD., have documented all relevant documentation on the behalf of [name], MD, as directed by [name], MD while in the presence of [name], MD.  10/06/2023 3:40 PM   Clinton Watts Nov 07, 1955 [id number]  Referring provider: Judy Pimple, MD [address] [place],  [place] [address]  Chief Complaint  Patient presents with   Follow-up   Urologic history: 1.  Erectile dysfunction PDE 5 inhibitor therapy   HPI: Clinton Watts is a 68 y.o. male presents for annual follow-up.  Complaining of difficulty maintaining an erection at last visit, and his sildenafil was titrated to 100 mg. No significant improvement in his ED. Testosterone level at last visit was low at 22, however he has declined TRT.  No bothersome LUTS Denies dysuria, gross hematuria PSA 10/03/23 stable at 0.9   PSA trend   Prostate Specific Ag, Serum  Latest Ref Rng 0.0 - 4.0 ng/mL  07/19/2019 0.7   07/27/2020 0.8   08/23/2021 1.1   09/16/2022 1.2   10/03/2023 0.9      PMH: Past Medical History:  Diagnosis Date   Diabetes mellitus without complication [facility])    ED (erectile dysfunction)    HIV (human immunodeficiency virus infection) (HCC) 10/12/2017   Hyperlipidemia     Surgical History: Past Surgical History:  Procedure Laterality Date   HERNIA REPAIR      Home Medications:  Allergies as of 10/06/2023   No Known Allergies      Medication List        Accurate as of October 06, 2023  3:40 PM. If you have any questions, ask your nurse or doctor.          STOP taking these medications    sildenafil 100 MG tablet Commonly known as: VIAGRA Stopped by: [name]       TAKE these medications    amLODipine 5 MG tablet Commonly known as: NORVASC Take 1 tablet (5 mg total) by mouth daily.   dapagliflozin propanediol 5 MG Tabs tablet Commonly known as: Farxiga Take 1 tablet (5 mg total) by mouth daily  before breakfast. Pleae bill copay card secondary - BIN  [id number], PCN  CN, GRP  [id number], ID  [id number]   dapagliflozin propanediol 5 MG Tabs tablet Commonly known as: Farxiga Take 1 tablet (5 mg total) by mouth daily before breakfast.   Dovato 50-300 MG tablet Generic drug: dolutegravir-lamiVUDine Take 1 tablet by mouth daily.   glipiZIDE 10 MG 24 hr tablet Commonly known as: GLUCOTROL XL TAKE 1 TABLET DAILY WITH   BREAKFAST   losartan 100 MG tablet Commonly known as: COZAAR TAKE 1 TABLET DAILY   metFORMIN 500 MG 24 hr tablet Commonly known as: GLUCOPHAGE-XR Take one pill by mouth in am and 2 pills in the evening   Ozempic (0.25 or 0.5 MG/DOSE) 2 MG/3ML Sopn Generic drug: Semaglutide(0.25 or 0.5MG /DOS) Inject 0.25 mg into the skin once a week.   rosuvastatin 20 MG tablet Commonly known as: CRESTOR TAKE 1 TABLET BY MOUTH EVERY DAY   tadalafil 20 MG tablet Commonly known as: CIALIS 1 tab 1 hour prior to intercourse Started by: [name]   tadalafil 5 MG tablet Commonly known as: CIALIS Take 1 tablet (5 mg total) by mouth daily. Started by: [name]        Allergies: No Known Allergies  Family History: Family History  Problem Relation Age of Onset   Diabetes Mother    Stomach cancer Sister    Pancreatic cancer Brother    Prostate cancer Neg Hx    Bladder Cancer Neg Hx    Kidney cancer Neg Hx     Social History:  reports that he has never smoked. He has never used smokeless tobacco. He reports that he does not drink alcohol and does not use drugs.   Physical Exam: BP 125/74   Pulse 77   Constitutional:  Alert and oriented, No acute distress. HEENT: [place] AT Respiratory: Normal respiratory effort, no increased work of breathing. GU: Prostate 50 grams smooth without nodules. Psychiatric: Normal mood and affect.   Assessment & Plan:    1. Erectile dysfunction Has failed sildenafil and tadadafil. We discussed second-line options of  vacuum erection devices and intracavernosal injections, however he is not interested in these treatments.  We did discuss a trial of daily tadalafil 5 mg daily, along with demand dosing with tadalafil 20 mg which he will try for 1 month.  2. Prostate cancer screening Benign DRE and low/stable PSA  Continue annual follow-up.  I have reviewed the above documentation for accuracy and completeness, and I agree with the above.   [name], MD  [facility] [address] [place], [place] [address] [phone]

## 2023-10-17 ENCOUNTER — Other Ambulatory Visit (INDEPENDENT_AMBULATORY_CARE_PROVIDER_SITE_OTHER): Payer: Self-pay | Admitting: Pharmacist

## 2023-10-17 DIAGNOSIS — Z7984 Long term (current) use of oral hypoglycemic drugs: Secondary | ICD-10-CM

## 2023-10-17 DIAGNOSIS — E119 Type 2 diabetes mellitus without complications: Secondary | ICD-10-CM

## 2023-10-17 NOTE — Progress Notes (Unsigned)
 Reason for Documentation:  Patient has been identified as being included in [place] A1c measure.  Last A1c: 8.8% (07/19/23) Next A1c Due: 10/17/23 Next PCP visit: Not scheduled  Ozempic previously unaffordable, never started. Farxiga 5 mg started given cheaper cost ($0 with coupon) - Started ~09/28/23  Reported Regimen: Metformin XR 500 mg am, 1000 mg pm Glipizide XL 10 mg daily  Farxiga 5 mg daily   ___________________________________ Today, patient reports that [place] copay ended up being $15/month when it should have been $0 based on test claim with copay card.   Elvina Hammers test claim showing as $0.  Upon checking other medications, Test claims per Epic have changed since last discussion though reason is unclear.    Test claims as of 4/15:   - Ozempic now processes for $0 - Mounjaro now processes for $0  - Elvina Hammers now processes for $0 - Jardiance now processes for $0   Pharmacy Recommendations: GLP1 ideal in terms of glycemic reduction potential, though patient reports inability to afford copays at this time.   Due for repeat renal function ~4 weeks after [place] initiation Future: Consider metformin titration to goal dose 1000 mg BID Labs pended to PCP for consideration: BMP, A1c  Elvina Hammers should be $0 though patient was charged $15. Attempted test claim in Epic which is not going through. Jardiance test claim now going through for $0 (prior $80)   Will discuss with PCP: consideration of Farxiga titration to 10 mg if renal labs WNL and A1c remains above goal.   Follow Up: Patient given direct line for further questions/concerns. Lab visit scheduled 10/23/23: 10:45 am  Messaged clinical pool for PCP follow up  Daron Ellen, PharmD Clinical Pharmacist [facility] [phone]

## 2023-10-22 ENCOUNTER — Other Ambulatory Visit: Payer: Self-pay | Admitting: Family Medicine

## 2023-10-23 ENCOUNTER — Other Ambulatory Visit (INDEPENDENT_AMBULATORY_CARE_PROVIDER_SITE_OTHER)

## 2023-10-23 ENCOUNTER — Encounter: Payer: Self-pay | Admitting: Pharmacist

## 2023-10-23 ENCOUNTER — Other Ambulatory Visit (INDEPENDENT_AMBULATORY_CARE_PROVIDER_SITE_OTHER): Payer: Self-pay | Admitting: Pharmacist

## 2023-10-23 DIAGNOSIS — Z7984 Long term (current) use of oral hypoglycemic drugs: Secondary | ICD-10-CM | POA: Diagnosis not present

## 2023-10-23 DIAGNOSIS — E119 Type 2 diabetes mellitus without complications: Secondary | ICD-10-CM | POA: Diagnosis not present

## 2023-10-23 LAB — BASIC METABOLIC PANEL WITH GFR
BUN: 18 mg/dL (ref 6–23)
CO2: 29 meq/L (ref 19–32)
Calcium: 10.1 mg/dL (ref 8.4–10.5)
Chloride: 99 meq/L (ref 96–112)
Creatinine, Ser: 1.21 mg/dL (ref 0.40–1.50)
GFR: 60.87 mL/min (ref >60.00)
Glucose, Bld: 284 mg/dL — ABNORMAL HIGH (ref 70–99)
Potassium: 5.1 meq/L (ref 3.5–5.1)
Sodium: 136 meq/L (ref 135–145)

## 2023-10-23 LAB — HEMOGLOBIN A1C: Hgb A1c MFr Bld: 9 % — ABNORMAL HIGH (ref 4.6–6.5)

## 2023-10-23 MED ORDER — DAPAGLIFLOZIN PROPANEDIOL 10 MG PO TABS
10.0000 mg | ORAL_TABLET | Freq: Every day | ORAL | 2 refills | Status: DC
Start: 2023-10-23 — End: 2024-01-04

## 2023-10-23 NOTE — Progress Notes (Signed)
 Brief Telephone Documentation Reason for Call: Lab results / Medication Refill  Summary of Call: Patient presented for routine lab monitoring 4 weeks s/p Farxiga  initiation. Called to discuss results/refills.   As previously documented, test claims after initial Farxiga  fill have changed.  Evone Hoh continues to show as $0, as does Ozempic  and Mounjaro (previously too expensive). No deductible was listed, though possible he has hit a medical deductible or OOP spending limit.   Discussed this with patient. Noted that test claims are not perfect, though at any point we can send a prescription to confirm the cost at his pharmacy. He is agreeable to continuation of Farxiga  (or Jardiance if cheaper).   Assessment: A1c relatively unchanged at 9% (previously 8.8%) though not reflective of recent medication changes. BMP WNL. Creatinine not exceeding expected 30% increase s/p SGLT2i. Increase dose per hyperglycemia, 10 mg daily.   Strong recommendation for GLP1RA given worsening A1c, previously declined due to cost. Test claims suggest $0/mo though patient somewhat hesitant to additional medication. Reviewed degree of A1c-reduction expected w SGLT2i (~1 point) - will likely need combination of therapies. Noted additional benefits of both classes (SGLT2i/GLP1). He is open though prefers time to do his own research.   Plan: Increase Farxiga  to 10 mg daily Strongly recommend GLP1RA if truly $0 - Mounjaro or Ozempic  (patient to think on this decision). MyChart message sent summarizing GLP1 benefits.   Follow Up: Patient given direct line for further questions/concerns.  Daron Ellen, PharmD Clinical Pharmacist [facility] [phone]

## 2023-10-23 NOTE — Addendum Note (Signed)
 Addended by: Queenie Brunet on: 10/23/2023 10:34 AM   Modules accepted: Orders

## 2023-10-24 ENCOUNTER — Encounter: Payer: Self-pay | Admitting: Family Medicine

## 2023-10-24 ENCOUNTER — Ambulatory Visit: Admitting: Family Medicine

## 2023-10-24 VITALS — BP 146/68 | HR 95 | Temp 98.7°F | Ht 68.0 in | Wt 173.4 lb

## 2023-10-24 DIAGNOSIS — R6883 Chills (without fever): Secondary | ICD-10-CM

## 2023-10-24 DIAGNOSIS — J22 Unspecified acute lower respiratory infection: Secondary | ICD-10-CM

## 2023-10-24 DIAGNOSIS — R051 Acute cough: Secondary | ICD-10-CM

## 2023-10-24 LAB — POC COVID19 BINAXNOW: SARS Coronavirus 2 Ag: NEGATIVE

## 2023-10-24 LAB — POCT INFLUENZA A/B
Influenza A, POC: NEGATIVE
Influenza B, POC: NEGATIVE

## 2023-10-24 MED ORDER — BENZONATATE 100 MG PO CAPS: 100.0000 mg | ORAL_CAPSULE | Freq: Three times a day (TID) | ORAL | 1 refills | Status: DC | PRN

## 2023-10-24 NOTE — Progress Notes (Signed)
 Ph: [phone] Fax: [phone]   Patient ID: Clinton Watts, male    DOB: October 14, 1955, 68 y.o.   MRN: [id number]  This visit was conducted in person.  BP (!) 146/68   Pulse 95   Temp 98.7 F (37.1 C) (Oral)   Ht 5\' 8"  (1.727 m)   Wt 173 lb 6 oz (78.6 kg)   SpO2 95%   BMI 26.36 kg/m   BP Readings from Last 3 Encounters:  10/24/23 (!) 146/68  10/06/23 125/74  07/19/23 135/75    CC: cough, congestion, sneezing Subjective:   HPI: Clinton Watts is a 68 y.o. male presenting on 10/24/2023 for Cough (C/o cough, sneezing, HA, chills, and fatigue. Sxs start 10/22/23. )   Continues working  2d h/o cough, sneezing, headache, chills and fatigue, ST, PNDrainage. Dry cough.   No fever, body aches, ear or tooth pain, abd pain, nausea, diarrhea, loss of taste/smell, dyspnea or wheezing.    Tried OTC dayquil, nyquil.   No sick contacts at home.  No h/o asthma, COPD Non smoker   H/o controlled HIV on Dovato  regularly sees ID.  Known diabetic on metformin , glipizide , farixga. Planning to try Ozempic .  Lab Results  Component Value Date   HGBA1C 9.0 (H) 10/23/2023        Relevant past medical, surgical, family and social history reviewed and updated as indicated. Interim medical history since our last visit reviewed. Allergies and medications reviewed and updated. Outpatient Medications Prior to Visit  Medication Sig Dispense Refill   amLODipine  (NORVASC ) 5 MG tablet TAKE 1 TABLET DAILY 90 tablet 2   dapagliflozin  propanediol (FARXIGA ) 10 MG TABS tablet Take 1 tablet (10 mg total) by mouth daily. 30 tablet 2   dolutegravir -lamiVUDine  (DOVATO ) 50-300 MG tablet Take 1 tablet by mouth daily. 30 tablet 11   glipiZIDE  (GLUCOTROL  XL) 10 MG 24 hr tablet TAKE 1 TABLET DAILY WITH   BREAKFAST 90 tablet 1   losartan  (COZAAR ) 100 MG tablet TAKE 1 TABLET DAILY 90 tablet 1   metFORMIN  (GLUCOPHAGE -XR) 500 MG 24 hr tablet Take one pill by mouth in am and 2 pills in the evening 270 tablet 1    rosuvastatin  (CRESTOR ) 20 MG tablet TAKE 1 TABLET BY MOUTH EVERY DAY 90 tablet 0   Semaglutide ,0.25 or 0.5MG /DOS, (OZEMPIC , 0.25 OR 0.5 MG/DOSE,) 2 MG/3ML SOPN Inject 0.25 mg into the skin once a week. 3 mL 0   tadalafil  (CIALIS ) 20 MG tablet 1 tab 1 hour prior to intercourse 10 tablet 0   tadalafil  (CIALIS ) 5 MG tablet Take 1 tablet (5 mg total) by mouth daily. 30 tablet 0   No facility-administered medications prior to visit.     Per HPI unless specifically indicated in ROS section below Review of Systems  Objective:  BP (!) 146/68   Pulse 95   Temp 98.7 F (37.1 C) (Oral)   Ht 5\' 8"  (1.727 m)   Wt 173 lb 6 oz (78.6 kg)   SpO2 95%   BMI 26.36 kg/m   Wt Readings from Last 3 Encounters:  10/24/23 173 lb 6 oz (78.6 kg)  07/19/23 174 lb (78.9 kg)  03/31/23 173 lb (78.5 kg)      Physical Exam Vitals and nursing note reviewed.  Constitutional:      Appearance: Normal appearance. He is not ill-appearing.     Comments: Appears younger than stated age  HENT:     Head: Normocephalic and atraumatic.     Right Ear: Hearing, tympanic membrane,  ear canal and external ear normal. There is no impacted cerumen.     Left Ear: Hearing, tympanic membrane, ear canal and external ear normal. There is no impacted cerumen.     Nose: No mucosal edema.     Right Turbinates: Not enlarged or swollen.     Left Turbinates: Not enlarged or swollen.     Right Sinus: No maxillary sinus tenderness or frontal sinus tenderness.     Left Sinus: No maxillary sinus tenderness or frontal sinus tenderness.     Comments: Wearing mask    Mouth/Throat:     Mouth: Mucous membranes are moist.     Pharynx: Oropharynx is clear. No oropharyngeal exudate or posterior oropharyngeal erythema.  Eyes:     Extraocular Movements: Extraocular movements intact.     Conjunctiva/sclera: Conjunctivae normal.     Pupils: Pupils are equal, round, and reactive to light.  Cardiovascular:     Rate and Rhythm: Normal rate and  regular rhythm.     Pulses: Normal pulses.     Heart sounds: Normal heart sounds. No murmur heard. Pulmonary:     Effort: Pulmonary effort is normal. No respiratory distress.     Breath sounds: Normal breath sounds. No wheezing, rhonchi or rales.     Comments: Lungs clear  Musculoskeletal:     Cervical back: Normal range of motion and neck supple. No rigidity.     Right lower leg: No edema.     Left lower leg: No edema.  Lymphadenopathy:     Cervical: Cervical adenopathy (shotty bilateral) present.  Skin:    General: Skin is warm and dry.     Findings: No rash.  Neurological:     Mental Status: He is alert.  Psychiatric:        Mood and Affect: Mood normal.        Behavior: Behavior normal.       Results for orders placed or performed in visit on 10/24/23  POCT Influenza A/B   Collection Time: 10/24/23 12:52 PM  Result Value Ref Range   Influenza A, POC Negative Negative   Influenza B, POC Negative Negative  POC COVID-19 BinaxNow   Collection Time: 10/24/23 12:53 PM  Result Value Ref Range   SARS Coronavirus 2 Ag Negative Negative    Assessment & Plan:   Problem List Items Addressed This Visit     Acute respiratory infection - Primary   Anticipate viral given short duration and reassuring exam COVID and flu swabs negative today Supportive measures reviewed. Rx tessalon  perls for cough. Recommend avoid decongestants, may use Coricidin brand cold remedies.       Other Visit Diagnoses       Acute cough       Relevant Orders   POC COVID-19 BinaxNow (Completed)     Chills       Relevant Orders   POCT Influenza A/B (Completed)        Meds ordered this encounter  Medications   benzonatate  (TESSALON ) 100 MG capsule    Sig: Take 1 capsule (100 mg total) by mouth 3 (three) times daily as needed for cough.    Dispense:  30 capsule    Refill:  1    Orders Placed This Encounter  Procedures   POC COVID-19 BinaxNow    Previously tested for COVID-19:   Yes     Resident in a congregate (group) care setting:   No    Employed in healthcare setting:   No  POCT Influenza A/B    Patient Instructions  You have a viral upper respiratory infection. Antibiotics are not needed for this.  Viral infections usually take 7-10 days to resolve.  The cough can last a few weeks to go away. Use medication as prescribed: tessalon  perls Avoid decongestants. May use Coricidin brand over the counter cold medications as needed.  Push fluids and plenty of rest. Return if you are not improving as expected, or if you have high fevers (>101.5) or difficulty swallowing or worsening productive cough. Call clinic with questions.  Good to see you today. I hope you start feeling better soon.   Follow up plan: Return if symptoms worsen or fail to improve.  Claire Crick, MD

## 2023-10-24 NOTE — Assessment & Plan Note (Signed)
 Anticipate viral given short duration and reassuring exam COVID and flu swabs negative today Supportive measures reviewed. Rx tessalon  perls for cough. Recommend avoid decongestants, may use Coricidin brand cold remedies.

## 2023-10-24 NOTE — Patient Instructions (Addendum)
 You have a viral upper respiratory infection. Antibiotics are not needed for this.  Viral infections usually take 7-10 days to resolve.  The cough can last a few weeks to go away. Use medication as prescribed: tessalon  perls Avoid decongestants. May use Coricidin brand over the counter cold medications as needed.  Push fluids and plenty of rest. Return if you are not improving as expected, or if you have high fevers (>101.5) or difficulty swallowing or worsening productive cough. Call clinic with questions.  Good to see you today. I hope you start feeling better soon.

## 2023-10-25 ENCOUNTER — Other Ambulatory Visit

## 2023-10-25 NOTE — Progress Notes (Deleted)
 Sick day guidelines GLP1??  Farxiga  dose?? ***

## 2023-11-03 ENCOUNTER — Other Ambulatory Visit: Payer: Self-pay | Admitting: Pharmacy Technician

## 2023-11-03 ENCOUNTER — Other Ambulatory Visit: Payer: Self-pay

## 2023-11-03 NOTE — Progress Notes (Signed)
 Specialty Pharmacy Refill Coordination Note  Clinton Watts is a 68 y.o. male contacted today regarding refills of specialty medication(s) Dolutegravir -lamiVUDine  (Dovato )   Patient requested Delivery   Delivery date: 11/08/23   Verified address: [address]  [place] [address]   Medication will be filled on 11/07/23.

## 2023-11-08 ENCOUNTER — Other Ambulatory Visit: Payer: Self-pay | Admitting: Urology

## 2023-11-15 ENCOUNTER — Encounter: Payer: Self-pay | Admitting: Pharmacist

## 2023-11-15 ENCOUNTER — Other Ambulatory Visit (INDEPENDENT_AMBULATORY_CARE_PROVIDER_SITE_OTHER): Payer: Self-pay | Admitting: Pharmacist

## 2023-11-15 DIAGNOSIS — E119 Type 2 diabetes mellitus without complications: Secondary | ICD-10-CM

## 2023-11-15 DIAGNOSIS — Z7984 Long term (current) use of oral hypoglycemic drugs: Secondary | ICD-10-CM

## 2023-11-15 NOTE — Progress Notes (Signed)
 Chart Review:  Date of review: 11/15/23 Patient was identified as falling into the [facility] for Diabetes control.     In recent months had assisted patient with cost concerns surrounding diabetes therapies. Most recently, Farxiga  5 mg daily was started (~2 months ago).   At time of initial outreach, all Brand medications were cost-prohibitive. Though since this, test claims are resulting in $0 copays.   Previously discussed GLP1RA as strong recommendation though patient wanted time to do his own research. Have not been able to contact him since.   11/15/23:  - Called patient, no answer.  - Left voicemail informing him that his medication [facility] ([facility]  - Provided patient with direct call-back line    Last A1c: 9.0% (10/23/23) Next A1c Due: 01/21/24 Next Scheduled Visit: N/A  Pharmacy Recommendations/Plan:  PCP as well as myself have discussed GLP1-RA as a strong recommendation (patient hesitant to med changes) Due to run out of Farxiga  on 11/23/23. Called and left message informing [facility].  [facility] and confirmed that his refill for 90 day supply of Farxiga  10 mg tablets was processed successfully for $0 copay.   Follow Up:  Pharm chart review: 0.5 months.  Mychat message sent to patient today   Future Appointments  Date Time Provider Department Center  10/07/2024  9:15 AM [facility] None  10/10/2024  1:45 PM Stoioff, Kizzie Perks, MD [facility] None

## 2023-11-20 ENCOUNTER — Telehealth: Payer: Self-pay

## 2023-11-20 NOTE — Telephone Encounter (Signed)
 Patient was identified as falling into the [facility].   Patient was: Referred to pharmacy for chronic disease management.   Patient was referred to pharmacy and at 07/19/23 OV requested on month follow up. Don't see where that was made. Do you want him scheduled for follow up?

## 2023-11-20 NOTE — Telephone Encounter (Signed)
 Per Llana Rile (pharmacy)   Just fyi --  have not been able to successfully contact patient for the past several weeks. Will keep him on my radar for refills/compliance. No follow up scheduled currently.

## 2023-12-01 ENCOUNTER — Other Ambulatory Visit: Payer: Self-pay

## 2023-12-01 ENCOUNTER — Encounter: Payer: Self-pay | Admitting: Pharmacist

## 2023-12-01 NOTE — Progress Notes (Signed)
 Specialty Pharmacy Refill Coordination Note  Clinton Watts is a 68 y.o. male contacted today regarding refills of specialty medication(s) Dolutegravir -lamiVUDine  (Dovato )   Patient requested Delivery   Delivery date: 12/06/23   Verified address: [address]  [name] [address]   Medication will be filled on 06.03.25.

## 2023-12-01 NOTE — Progress Notes (Signed)
 Chart Review:  Date of review: 12/01/23 Patient was identified as falling into the [facility] for Diabetes control.     Have been unable to reach patient. Chart review completed to ensure ongoing medication access.   Last A1c: 9.0% (10/23/23) Next A1c Due: 01/21/24 Next Scheduled Visit: None  12/01/23:  Confirmed with insurance refill history, Farxiga  10 mg tablet [facility]16[facility]2023 x30 day supply. 1 refill remaining  Follow Up:  Pharm chart review: ~1 month  Future Appointments  Date Time Provider Department Center  10/07/2024  9:15 AM [facility] None  10/10/2024  1:45 PM Stoioff, Kizzie Perks, MD [facility] None

## 2023-12-05 ENCOUNTER — Other Ambulatory Visit: Payer: Self-pay

## 2023-12-06 ENCOUNTER — Other Ambulatory Visit: Payer: Self-pay | Admitting: Family Medicine

## 2023-12-06 DIAGNOSIS — E119 Type 2 diabetes mellitus without complications: Secondary | ICD-10-CM

## 2023-12-19 ENCOUNTER — Encounter: Payer: Self-pay | Admitting: Pharmacist

## 2023-12-19 NOTE — Progress Notes (Signed)
 Chart Review:  Date of review: 12/19/23 Patient was identified as falling into the [facility] for Diabetes control.     Have been unable to reach patient. Chart review completed to ensure ongoing medication access.   Last A1c: 9.0% (10/23/23) Next A1c Due: 01/21/24 Next Scheduled Visit: None  Overdue for Farxiga  refill. Last dispensed 30 day supply 11/16/23. 1 refill remaining.   Insurance report was not up to date. [employer] Claims data shows refill for Farxiga  10 mg daily as of 12/10/23. 9 RF remaining.   Follow Up:  Pharm chart review: ~1 month to ensure prescription is renewed and medication filled.   Future Appointments  Date Time Provider Department Center  10/07/2024  9:15 AM [facility] None  10/10/2024  1:45 PM Stoioff, Kizzie Perks, MD [facility] None

## 2023-12-28 ENCOUNTER — Other Ambulatory Visit: Payer: Self-pay | Admitting: Family Medicine

## 2023-12-28 DIAGNOSIS — E119 Type 2 diabetes mellitus without complications: Secondary | ICD-10-CM

## 2024-01-03 ENCOUNTER — Other Ambulatory Visit: Payer: Self-pay | Admitting: Family Medicine

## 2024-01-03 ENCOUNTER — Other Ambulatory Visit (HOSPITAL_COMMUNITY): Payer: Self-pay

## 2024-01-03 ENCOUNTER — Other Ambulatory Visit: Payer: Self-pay

## 2024-01-03 NOTE — Progress Notes (Signed)
 Specialty Pharmacy Refill Coordination Note  Spoke with Clinton Watts (Self)   Clinton Watts is a 68 y.o. male contacted today regarding refills of specialty medication(s) Dolutegravir -lamiVUDine  (Dovato )  Patient requested: Delivery   Delivery date: 01/08/24   Verified address: [address]  [place] [address]  Medication will be filled on 01/04/24.

## 2024-01-04 ENCOUNTER — Other Ambulatory Visit: Payer: Self-pay | Admitting: Urology

## 2024-01-04 NOTE — Telephone Encounter (Signed)
Unable to leave vm, sent mychart message.

## 2024-01-04 NOTE — Telephone Encounter (Signed)
 Last filled on 10/23/23 #30 tab/ 2 refills.   Last OV was on 07/19/23, but pt did have A1c checked on 10/23/23, no future appts

## 2024-01-04 NOTE — Telephone Encounter (Signed)
Please schedule follow-up with me. Thanks

## 2024-01-08 NOTE — Telephone Encounter (Signed)
 LVM to schedule ov regarding medication to receive future refills

## 2024-01-15 NOTE — Telephone Encounter (Signed)
 Spoke to pt, sch f/u for 01/26/24

## 2024-01-26 ENCOUNTER — Ambulatory Visit (INDEPENDENT_AMBULATORY_CARE_PROVIDER_SITE_OTHER): Admitting: Family Medicine

## 2024-01-26 ENCOUNTER — Encounter: Payer: Self-pay | Admitting: Family Medicine

## 2024-01-26 ENCOUNTER — Other Ambulatory Visit: Payer: Self-pay

## 2024-01-26 VITALS — BP 128/70 | HR 97 | Temp 98.4°F | Ht 68.0 in | Wt 168.4 lb

## 2024-01-26 DIAGNOSIS — E119 Type 2 diabetes mellitus without complications: Secondary | ICD-10-CM

## 2024-01-26 DIAGNOSIS — E1169 Type 2 diabetes mellitus with other specified complication: Secondary | ICD-10-CM | POA: Diagnosis not present

## 2024-01-26 DIAGNOSIS — I10 Essential (primary) hypertension: Secondary | ICD-10-CM | POA: Diagnosis not present

## 2024-01-26 DIAGNOSIS — E785 Hyperlipidemia, unspecified: Secondary | ICD-10-CM | POA: Diagnosis not present

## 2024-01-26 DIAGNOSIS — Z7984 Long term (current) use of oral hypoglycemic drugs: Secondary | ICD-10-CM

## 2024-01-26 LAB — POCT GLYCOSYLATED HEMOGLOBIN (HGB A1C): Hemoglobin A1C: 7.1 % — AB (ref 4.0–5.6)

## 2024-01-26 MED ORDER — DAPAGLIFLOZIN PROPANEDIOL 10 MG PO TABS: 10.0000 mg | ORAL_TABLET | Freq: Every day | ORAL | 3 refills | Status: AC

## 2024-01-26 NOTE — Assessment & Plan Note (Signed)
 Lab Results  Component Value Date   HGBA1C 7.1 (A) 01/26/2024   HGBA1C 9.0 (H) 10/23/2023   HGBA1C 8.8 (A) 07/19/2023   Improved  Metformin  xr 500 mg am , 1000 mg pm Farxiga  10 mg daily  Pt was unable to afford semaglutide  Eaitng better and more exercise  Microalbumin ordered  On a statin and ARB Sent for last dm eye exam   Follow up 3 mo for annual exam
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
## 2024-01-26 NOTE — Assessment & Plan Note (Signed)
 Disc goals for lipids and reasons to control them Rev last labs with pt Rev low sat fat diet in detail Last LDL 70   Labs ordered Continues crestor  20 mg daily  Tolerates this

## 2024-01-26 NOTE — Assessment & Plan Note (Signed)
 bp in fair control at this time  BP Readings from Last 1 Encounters:  01/26/24 128/70  At home slightly lower No changes needed Most recent labs reviewed  Disc lifstyle change with low sodium diet and exercise   Can consider thiazide diuretic in future if needed  K is normalized now on losartan    Continues Losartan  100 mg daily  Amlodipine  5 mg daily

## 2024-01-26 NOTE — Patient Instructions (Addendum)
 Stay active Be careful in the heat Add some strength training to your routine, this is important for bone and brain health and can reduce your risk of falls and help your body use insulin properly and regulate weight  Light weights, exercise bands , and internet videos are a good way to start  Yoga (chair or regular), machines , floor exercises or a gym with machines are also good options    Keep working on a low glycemic diet  Try to get most of your carbohydrates from produce (with the exception of white potatoes) and whole grains Eat less bread/pasta/rice/snack foods/cereals/sweets and other items from the middle of the grocery store (processed carbs)  Diabetes is improving  Almost to goal   You can use some over the counter delsym for cough if needed Lungs sound clear today  If symptoms worsen let us  know   Your diabetic eye exam is due next month Be sure to schedule that

## 2024-01-27 LAB — MICROALBUMIN / CREATININE URINE RATIO
Creatinine, Urine: 60 mg/dL (ref 20–320)
Microalb, Ur: <0.2 mg/dL

## 2024-01-28 ENCOUNTER — Ambulatory Visit: Payer: Self-pay | Admitting: Family Medicine

## 2024-01-30 ENCOUNTER — Other Ambulatory Visit (HOSPITAL_COMMUNITY): Payer: Self-pay

## 2024-01-31 ENCOUNTER — Other Ambulatory Visit: Payer: Self-pay

## 2024-01-31 ENCOUNTER — Other Ambulatory Visit (HOSPITAL_COMMUNITY): Payer: Self-pay

## 2024-01-31 NOTE — Progress Notes (Signed)
 Specialty Pharmacy Refill Coordination Note  Clinton Watts is a 68 y.o. male contacted today regarding refills of specialty medication(s) Dolutegravir -lamiVUDine  (Dovato )   Patient requested Delivery   Delivery date: 02/01/24   Verified address: [address]  [place] [address]   Medication will be filled on 01/31/24.

## 2024-02-12 ENCOUNTER — Other Ambulatory Visit: Payer: Self-pay | Admitting: Family Medicine

## 2024-02-12 DIAGNOSIS — I10 Essential (primary) hypertension: Secondary | ICD-10-CM

## 2024-02-12 DIAGNOSIS — E119 Type 2 diabetes mellitus without complications: Secondary | ICD-10-CM

## 2024-02-22 DIAGNOSIS — H04123 Dry eye syndrome of bilateral lacrimal glands: Secondary | ICD-10-CM | POA: Diagnosis not present

## 2024-02-22 DIAGNOSIS — H524 Presbyopia: Secondary | ICD-10-CM | POA: Diagnosis not present

## 2024-02-22 DIAGNOSIS — H5203 Hypermetropia, bilateral: Secondary | ICD-10-CM | POA: Diagnosis not present

## 2024-02-22 DIAGNOSIS — H52223 Regular astigmatism, bilateral: Secondary | ICD-10-CM | POA: Diagnosis not present

## 2024-02-22 DIAGNOSIS — E119 Type 2 diabetes mellitus without complications: Secondary | ICD-10-CM | POA: Diagnosis not present

## 2024-02-26 ENCOUNTER — Other Ambulatory Visit (HOSPITAL_COMMUNITY): Payer: Self-pay

## 2024-02-26 ENCOUNTER — Other Ambulatory Visit: Payer: Self-pay

## 2024-02-26 NOTE — Progress Notes (Signed)
 Specialty Pharmacy Refill Coordination Note  Clinton Watts is a 69 y.o. male contacted today regarding refills of specialty medication(s) Dolutegravir -lamiVUDine  (Dovato )   Patient requested Delivery   Delivery date: 02/28/24   Verified address: [address]   [place] [address]   Medication will be filled on 02/27/24.

## 2024-02-29 ENCOUNTER — Other Ambulatory Visit: Payer: Self-pay | Admitting: Urology

## 2024-03-25 ENCOUNTER — Other Ambulatory Visit: Payer: Self-pay | Admitting: Pharmacy Technician

## 2024-03-25 ENCOUNTER — Other Ambulatory Visit: Payer: Self-pay

## 2024-03-25 ENCOUNTER — Other Ambulatory Visit: Payer: Self-pay | Admitting: Infectious Diseases

## 2024-03-25 DIAGNOSIS — B20 Human immunodeficiency virus [HIV] disease: Secondary | ICD-10-CM

## 2024-03-25 NOTE — Progress Notes (Signed)
 Specialty Pharmacy Refill Coordination Note  Clinton Watts is a 68 y.o. male contacted today regarding refills of specialty medication(s) Dolutegravir -lamiVUDine  (Dovato )   Patient requested Delivery   Delivery date: 03/29/24   Verified address: [address]  [place] [address]   Medication will be filled on 03/28/24.  This fill date is pending response to refill request from provider. Patient is aware and if they have not received fill by intended date they must follow up with pharmacy.

## 2024-03-26 ENCOUNTER — Other Ambulatory Visit: Payer: Self-pay

## 2024-03-26 MED ORDER — DOVATO 50-300 MG PO TABS
1.0000 | ORAL_TABLET | Freq: Every day | ORAL | 0 refills | Status: DC
  Filled 2024-03-26: qty 30, 30d supply, fill #0

## 2024-03-26 NOTE — Telephone Encounter (Signed)
 Yes, there is a drug interaction between his Dovato  and metformin . Usually, the recommended max dose of metformin  is 1000 mg total for a day, but it looks like he has been taking both for awhile now and is managed/watched carefully at his PCP's office. I would just say to watch out for increased GI side effects and that metformin  may need a dose reduction if he is experiencing that. Thanks Megan!

## 2024-03-26 NOTE — Telephone Encounter (Signed)
 Please advise regarding DDI with metformin . He takes 500 mg metformin  in the AM and 1,000 mg PM. Thanks!

## 2024-03-28 ENCOUNTER — Other Ambulatory Visit: Payer: Self-pay

## 2024-04-18 ENCOUNTER — Other Ambulatory Visit: Payer: Self-pay

## 2024-04-23 ENCOUNTER — Ambulatory Visit (INDEPENDENT_AMBULATORY_CARE_PROVIDER_SITE_OTHER): Admitting: Infectious Diseases

## 2024-04-23 ENCOUNTER — Encounter: Payer: Self-pay | Admitting: Infectious Diseases

## 2024-04-23 ENCOUNTER — Other Ambulatory Visit: Payer: Self-pay

## 2024-04-23 VITALS — BP 185/78 | HR 83 | Temp 97.9°F | Resp 16 | Wt 176.8 lb

## 2024-04-23 DIAGNOSIS — Z23 Encounter for immunization: Secondary | ICD-10-CM | POA: Diagnosis not present

## 2024-04-23 DIAGNOSIS — Z7984 Long term (current) use of oral hypoglycemic drugs: Secondary | ICD-10-CM

## 2024-04-23 DIAGNOSIS — E119 Type 2 diabetes mellitus without complications: Secondary | ICD-10-CM

## 2024-04-23 DIAGNOSIS — N529 Male erectile dysfunction, unspecified: Secondary | ICD-10-CM

## 2024-04-23 DIAGNOSIS — B20 Human immunodeficiency virus [HIV] disease: Secondary | ICD-10-CM | POA: Diagnosis not present

## 2024-04-23 DIAGNOSIS — Z79899 Other long term (current) drug therapy: Secondary | ICD-10-CM | POA: Diagnosis not present

## 2024-04-23 LAB — T-HELPER CELLS (CD4) COUNT (NOT AT ARMC)
CD4 % Helper T Cell: 26 % — ABNORMAL LOW (ref 33–65)
CD4 T Cell Abs: 384 /uL — ABNORMAL LOW (ref 400–1790)

## 2024-04-23 MED ORDER — TADALAFIL 5 MG PO TABS
5.0000 mg | ORAL_TABLET | Freq: Every day | ORAL | 3 refills | Status: DC
  Filled 2024-04-23: qty 30, 30d supply, fill #0

## 2024-04-23 MED ORDER — TADALAFIL 20 MG PO TABS
ORAL_TABLET | ORAL | 2 refills | Status: DC
  Filled 2024-04-23: qty 20, fill #0

## 2024-04-23 MED ORDER — TADALAFIL 20 MG PO TABS: ORAL_TABLET | ORAL | 2 refills | Status: AC

## 2024-04-23 MED ORDER — TADALAFIL 5 MG PO TABS: 5.0000 mg | ORAL_TABLET | Freq: Every day | ORAL | 3 refills | Status: AC

## 2024-04-23 MED ORDER — DOVATO 50-300 MG PO TABS
1.0000 | ORAL_TABLET | Freq: Every day | ORAL | 11 refills | Status: AC
  Filled 2024-04-23 – 2024-04-24 (×2): qty 30, 30d supply, fill #0
  Filled 2024-05-16 – 2024-05-17 (×2): qty 30, 30d supply, fill #1
  Filled 2024-06-14: qty 30, 30d supply, fill #2
  Filled 2024-07-08 – 2024-07-11 (×2): qty 30, 30d supply, fill #3
  Filled 2024-08-02: qty 30, 30d supply, fill #4

## 2024-04-23 NOTE — Progress Notes (Signed)
 Name: [name]  FMW:[id number]   DOB: [birth year]   PCP: Randeen Laine LABOR, MD    Subjective   [place] is a 68 y.o. male HIV disease Dx 93. Originally from [place].  HIV Risk: heterosexual contact. Previously in care with [place] in [place]  [phone]).  Hx OIs: none known     Previous Regimens: Combivir + Crixivan Atripla --> well controlled and undetectable for years, switched for TAF regimen Biktarvy  10/2017 - suppressed Dovato  09/2021    Genotypes: None on file or from previous provider    Discussed the use of AI scribe software for clinical note transcription with the patient, who gave verbal consent to proceed.  History of Present Illness   [place] is a 68 year old male with HIV who presents for routine follow-up care.  He feels well and has not experienced any new symptoms. He continues to take Dovato  for his HIV management, which he receives from [facility].   His diabetes management has improved, with his A1c at 7.1 as of July. He is on multiple medications for diabetes, which he states are effective.  He recently traveled to [place], visiting family in [place]. [place], [place], [place], and [place]. He maintains a close relationship with his children and reports that his wife is doing well. He had an enjoyable summer.  He is currently taking Cialis , preferring the 20 mg PRN dose, which he fills at [facility] in [place].SABRA  He is interested in receiving the flu and COVID vaccines and is up to date with all other vaccinations.        04/23/2024    8:47 AM 01/26/2024    2:49 PM 07/19/2023   10:21 AM  Depression screen PHQ 2/9  Decreased Interest 0 0 0  Down, Depressed, Hopeless 0 0 0  PHQ - 2 Score 0 0 0  Altered sleeping 0 0 0  Tired, decreased energy 0 0 0  Change in appetite 0 0 0  Feeling bad or failure about yourself  0 0 0  Trouble concentrating 0 0 0  Moving slowly or fidgety/restless 0 0 0  Suicidal  thoughts 0 0 0  PHQ-9 Score 0 0 0  Difficult doing work/chores Not difficult at all Not difficult at all Not difficult at all      01/26/2024    2:49 PM 07/19/2023   10:22 AM 03/10/2023    9:21 AM 11/10/2022    9:26 AM  GAD 7 : Generalized Anxiety Score  Nervous, Anxious, on Edge 0 0 0 0  Control/stop worrying 0 0 0 0  Worry too much - different things 0 0 0 0  Trouble relaxing 0 0 0 0  Restless 0 0 0 0  Easily annoyed or irritable 0 0 0 0  Afraid - awful might happen 0 0 0 0  Total GAD 7 Score 0 0 0 0  Anxiety Difficulty Not difficult at all Not difficult at all Not difficult at all         Review of Systems  Constitutional:  Negative for appetite change, chills, fatigue, fever and unexpected weight change.  Eyes:  Negative for visual disturbance.  Respiratory:  Negative for cough and shortness of breath.   Cardiovascular:  Negative for chest pain and leg swelling.  Gastrointestinal:  Negative for abdominal pain, diarrhea and nausea.  Genitourinary:  Negative for dysuria, genital sores and penile discharge.  Musculoskeletal:  Negative for joint swelling.  Skin:  Negative  for color change and rash.  Neurological:  Negative for dizziness and headaches.  Hematological:  Negative for adenopathy.  Psychiatric/Behavioral:  Negative for sleep disturbance. The patient is not nervous/anxious.      Objective   Today's Vitals   04/23/24 0846  Weight: 176 lb 12.8 oz (80.2 kg)  PainSc: 0-No pain   Body mass index is 26.88 kg/m.  Estimated body mass index is 26.88 kg/m as calculated from the following:   Height as of 01/26/24: 5' 8 (1.727 m).   Weight as of this encounter: 176 lb 12.8 oz (80.2 kg).      Physical Exam Vitals and nursing note reviewed.  Constitutional:      Appearance: He is well-developed.     Comments: Seated comfortably in chair during visit.   HENT:     Mouth/Throat:     Dentition: Normal dentition. No dental abscesses.  Cardiovascular:     Rate and  Rhythm: Normal rate and regular rhythm.     Heart sounds: Normal heart sounds.  Pulmonary:     Effort: Pulmonary effort is normal.     Breath sounds: Normal breath sounds.  Abdominal:     General: There is no distension.     Palpations: Abdomen is soft.     Tenderness: There is no abdominal tenderness.  Lymphadenopathy:     Cervical: No cervical adenopathy.  Skin:    General: Skin is warm and dry.     Findings: No rash.  Neurological:     Mental Status: He is alert and oriented to person, place, and time.  Psychiatric:        Judgment: Judgment normal.     Comments: In good spirits today and engaged in care discussion.      LABS:  HIV 1 RNA Quant (Copies/mL)  Date Value  03/31/2023 <20 (H)  05/17/2022 Not Detected  08/13/2021 Not Detected   CD4 T Cell Abs (/uL)  Date Value  09/23/2020 404  08/12/2019 502  09/19/2018 500    Lab Results  Component Value Date   CREATININE 1.21 10/23/2023   CREATININE 1.10 03/10/2023   CREATININE 1.03 11/10/2022    Lab Results  Component Value Date   WBC 6.5 11/10/2022   HGB 12.9 (L) 11/10/2022   HCT 41.4 11/10/2022   MCV 67.4 Repeated and verified X2. (L) 11/10/2022   PLT 219.0 11/10/2022    Lab Results  Component Value Date   ALT 10 11/10/2022   AST 21 11/10/2022   ALKPHOS 73 11/10/2022   BILITOT 0.5 11/10/2022       Assessment and Plan:    Human immunodeficiency virus (HIV) infection - HIV infection is well-controlled with Dovato . Viral load remains undetectable, and immune system is robust. Injectable HIV medications were discussed but deemed unsuitable due to potential resistance issues with past medications like Combivir and Atripla, increasing the risk of treatment failure beyond the general 2% risk. - Refill Dovato  prescription for one year at [facility]. - Order lab work to check viral load, immune system status, and kidney and liver function tests. - Administer flu and COVID vaccines.  Type 2  diabetes mellitus - Type 2 diabetes is well-managed with a recent A1c of 7.1, showing improvement. Kidney function is normal as per recent urine studies. Emphasized the importance of maintaining tight control through diet, stress management, and medication adherence. - Continue current diabetes management regimen per primary team  - continue statin   Erectile dysfunction - Erectile dysfunction is managed  with Cialis . He prefers the 20 mg dose over the 5 mg daily dose. Discussed cost-saving pharmacy options, such as [facility], which does not require a membership for pharmacy services. - Refill Cialis  20 mg prescription for two months at [facility] in [place]. - Advise to obtain a year-long prescription from urologist to avoid running out of medication.      Meds ordered this encounter  Medications   DISCONTD: tadalafil  (CIALIS ) 20 MG tablet    Sig: TAKE ONE TABLET 1 HOUR PRIOR TO INTERCOURSE    Dispense:  20 tablet    Refill:  2   DISCONTD: tadalafil  (CIALIS ) 5 MG tablet    Sig: Take 1 tablet (5 mg total) by mouth daily.    Dispense:  30 tablet    Refill:  3   tadalafil  (CIALIS ) 20 MG tablet    Sig: TAKE ONE TABLET 1 HOUR PRIOR TO INTERCOURSE    Dispense:  20 tablet    Refill:  2   tadalafil  (CIALIS ) 5 MG tablet    Sig: Take 1 tablet (5 mg total) by mouth daily.    Dispense:  30 tablet    Refill:  3   dolutegravir -lamiVUDine  (DOVATO ) 50-300 MG tablet    Sig: Take 1 tablet by mouth daily.    Dispense:  30 tablet    Refill:  11    Prescription Type::   Renewal   Orders Placed This Encounter  Procedures   Flu vaccine HIGH DOSE PF(Fluzone Trivalent)   Pfizer Comirnaty Covid-19 Vaccine 14yrs & older   HIV 1 RNA quant-no reflex-bld   T-helper cells (CD4) count   COMPLETE METABOLIC PANEL WITHOUT GFR   CBC w/Diff   Return in about 1 year (around 04/23/2025).   Corean Fireman, MSN, NP-C [facility]   [email or web address] Pager: [phone] Office: [phone] [facility] Main Line: [phone]

## 2024-04-23 NOTE — Patient Instructions (Addendum)
 Please continue the Dovato  everyday - more refills sent in for you.    Will refill your cialis  until you see your other doctor in a few months

## 2024-04-24 ENCOUNTER — Other Ambulatory Visit: Payer: Self-pay

## 2024-04-24 ENCOUNTER — Other Ambulatory Visit (HOSPITAL_COMMUNITY): Payer: Self-pay

## 2024-04-24 NOTE — Progress Notes (Signed)
 Specialty Pharmacy Refill Coordination Note  Spoke with [place]  [place] is a 68 y.o. male contacted today regarding refills of specialty medication(s) Dolutegravir -lamiVUDine  (Dovato )  Doses on hand: 7  Patient requested: Delivery   Delivery date: 04/26/24   Verified address: [address] [place] [address]  Medication will be filled on 04/25/24.

## 2024-04-25 ENCOUNTER — Ambulatory Visit: Payer: Self-pay | Admitting: Infectious Diseases

## 2024-04-25 ENCOUNTER — Other Ambulatory Visit: Payer: Self-pay

## 2024-04-25 LAB — COMPLETE METABOLIC PANEL WITHOUT GFR
AG Ratio: 1.5 (calc) (ref 1.0–2.5)
ALT: 12 U/L (ref 9–46)
AST: 30 U/L (ref 10–35)
Albumin: 4.8 g/dL (ref 3.6–5.1)
Alkaline phosphatase (APISO): 69 U/L (ref 35–144)
BUN: 24 mg/dL (ref 7–25)
CO2: 27 mmol/L (ref 20–32)
Calcium: 10 mg/dL (ref 8.6–10.3)
Chloride: 103 mmol/L (ref 98–110)
Creat: 1.1 mg/dL (ref 0.70–1.28)
Globulin: 3.3 g/dL (ref 1.9–3.7)
Glucose, Bld: 152 mg/dL — ABNORMAL HIGH (ref 65–99)
Potassium: 4.9 mmol/L (ref 3.5–5.3)
Sodium: 140 mmol/L (ref 135–146)
Total Bilirubin: 0.4 mg/dL (ref 0.2–1.2)
Total Protein: 8.1 g/dL (ref 6.1–8.1)

## 2024-04-25 LAB — CBC MORPHOLOGY

## 2024-04-25 LAB — CBC WITH DIFFERENTIAL/PLATELET
Absolute Lymphocytes: 1705 {cells}/uL (ref 850–3900)
Absolute Monocytes: 434 {cells}/uL (ref 200–950)
Basophils Absolute: 31 {cells}/uL (ref 0–200)
Basophils Relative: 0.5 %
Eosinophils Absolute: 81 {cells}/uL (ref 15–500)
Eosinophils Relative: 1.3 %
HCT: 45.1 % (ref 38.5–50.0)
Hemoglobin: 14.1 g/dL (ref 13.2–17.1)
MCH: 21.7 pg — ABNORMAL LOW (ref 27.0–33.0)
MCHC: 31.3 g/dL — ABNORMAL LOW (ref 32.0–36.0)
MCV: 69.3 fL — ABNORMAL LOW (ref 80.0–100.0)
MPV: 11 fL (ref 7.5–12.5)
Monocytes Relative: 7 %
Neutro Abs: 3949 {cells}/uL (ref 1500–7800)
Neutrophils Relative %: 63.7 %
Platelets: 253 Thousand/uL (ref 140–400)
RBC: 6.51 Million/uL — ABNORMAL HIGH (ref 4.20–5.80)
RDW: 18.2 % — ABNORMAL HIGH (ref 11.0–15.0)
Total Lymphocyte: 27.5 %
WBC: 6.2 Thousand/uL (ref 3.8–10.8)

## 2024-04-25 LAB — HIV-1 RNA QUANT-NO REFLEX-BLD
HIV 1 RNA Quant: NOT DETECTED {copies}/mL
HIV-1 RNA Quant, Log: NOT DETECTED {Log_copies}/mL

## 2024-05-13 ENCOUNTER — Other Ambulatory Visit: Payer: Self-pay | Admitting: Family Medicine

## 2024-05-13 DIAGNOSIS — E119 Type 2 diabetes mellitus without complications: Secondary | ICD-10-CM

## 2024-05-16 ENCOUNTER — Other Ambulatory Visit: Payer: Self-pay

## 2024-05-17 ENCOUNTER — Other Ambulatory Visit: Payer: Self-pay

## 2024-05-21 ENCOUNTER — Other Ambulatory Visit: Payer: Self-pay

## 2024-05-21 ENCOUNTER — Other Ambulatory Visit (HOSPITAL_COMMUNITY): Payer: Self-pay

## 2024-05-21 NOTE — Progress Notes (Signed)
 Specialty Pharmacy Refill Coordination Note  [place] is a 68 y.o. male contacted today regarding refills of specialty medication(s) Dolutegravir -lamiVUDine  (Dovato )   Patient requested Delivery   Delivery date: 05/23/24   Verified address: [address] [place] [address]   Medication will be filled on: 05/22/24

## 2024-05-28 ENCOUNTER — Ambulatory Visit (INDEPENDENT_AMBULATORY_CARE_PROVIDER_SITE_OTHER): Admitting: Family Medicine

## 2024-05-28 ENCOUNTER — Ambulatory Visit: Payer: Self-pay | Admitting: Family Medicine

## 2024-05-28 ENCOUNTER — Encounter: Payer: Self-pay | Admitting: Family Medicine

## 2024-05-28 VITALS — BP 156/72 | HR 67 | Temp 98.1°F | Ht 65.5 in | Wt 172.2 lb

## 2024-05-28 DIAGNOSIS — E1169 Type 2 diabetes mellitus with other specified complication: Secondary | ICD-10-CM | POA: Diagnosis not present

## 2024-05-28 DIAGNOSIS — Z7984 Long term (current) use of oral hypoglycemic drugs: Secondary | ICD-10-CM

## 2024-05-28 DIAGNOSIS — E785 Hyperlipidemia, unspecified: Secondary | ICD-10-CM

## 2024-05-28 DIAGNOSIS — Z0000 Encounter for general adult medical examination without abnormal findings: Secondary | ICD-10-CM | POA: Diagnosis not present

## 2024-05-28 DIAGNOSIS — I10 Essential (primary) hypertension: Secondary | ICD-10-CM | POA: Diagnosis not present

## 2024-05-28 DIAGNOSIS — E119 Type 2 diabetes mellitus without complications: Secondary | ICD-10-CM

## 2024-05-28 LAB — LIPID PANEL
Cholesterol: 266 mg/dL — ABNORMAL HIGH (ref 0–200)
HDL: 33.9 mg/dL — ABNORMAL LOW (ref >39.00)
LDL Cholesterol: 159 mg/dL — ABNORMAL HIGH (ref 0–99)
NonHDL: 232.38
Total CHOL/HDL Ratio: 8
Triglycerides: 369 mg/dL — ABNORMAL HIGH (ref 0.0–149.0)
VLDL: 73.8 mg/dL — ABNORMAL HIGH (ref 0.0–40.0)

## 2024-05-28 LAB — HEMOGLOBIN A1C: Hgb A1c MFr Bld: 6.8 % — ABNORMAL HIGH (ref 4.6–6.5)

## 2024-05-28 LAB — TSH: TSH: 1.08 u[IU]/mL (ref 0.35–5.50)

## 2024-05-28 MED ORDER — AMLODIPINE BESYLATE 5 MG PO TABS: 5.0000 mg | ORAL_TABLET | Freq: Every day | ORAL | 0 refills | Status: AC

## 2024-05-28 MED ORDER — ROSUVASTATIN CALCIUM 20 MG PO TABS: 20.0000 mg | ORAL_TABLET | Freq: Every day | ORAL | 3 refills | Status: AC

## 2024-05-28 MED ORDER — AMLODIPINE BESYLATE 5 MG PO TABS: 5.0000 mg | ORAL_TABLET | Freq: Every day | ORAL | 3 refills | Status: AC

## 2024-05-28 MED ORDER — ROSUVASTATIN CALCIUM 20 MG PO TABS: 20.0000 mg | ORAL_TABLET | Freq: Every day | ORAL | 0 refills | Status: AC

## 2024-05-28 NOTE — Assessment & Plan Note (Signed)
 A1c today   Metformin  xr 500 am and 1000 pm Farxiga  10 mg daily  Glipizide  xl 10 mg daily   Microalb utd   Eating better Good exercise Normal foot exam [facility] On statin and arb

## 2024-05-28 NOTE — Assessment & Plan Note (Signed)
 Reviewed health habits including diet and exercise and skin cancer prevention Reviewed appropriate screening tests for age  Also reviewed health mt list, fam hx and immunization status , as well as social and family history   See HPI Labs reviewed and ordered Health Maintenance  Topic Date Due   Complete foot exam   11/10/2023   Hemoglobin A1C  07/28/2024   COVID-19 Vaccine (8 - Pfizer risk 2025-26 season) 10/22/2024   Yearly kidney health urinalysis for diabetes  01/25/2025   Eye exam for diabetics  02/21/2025   Yearly kidney function blood test for diabetes  04/23/2025   Colon Cancer Screening  07/05/2025   DTaP/Tdap/Td vaccine (2 - Tdap) 11/09/2032   Pneumococcal Vaccine for age over 66  Completed   Flu Shot  Completed   Hepatitis C Screening  Completed   Zoster (Shingles) Vaccine  Completed   Meningitis B Vaccine  Aged Out    Discussed fall prevention, supplements and exercise for bone density  Commended good habits  PHQ 0

## 2024-05-28 NOTE — Progress Notes (Signed)
 Subjective:    Patient ID: [place], male    DOB: 07/26/1955, 68 y.o.   MRN: [id number]  HPI  Here for health maintenance exam and to review chronic medical problems   Wt Readings from Last 3 Encounters:  05/28/24 172 lb 4 oz (78.1 kg)  04/23/24 176 lb 12.8 oz (80.2 kg)  01/26/24 168 lb 6.4 oz (76.4 kg)   28.23 kg/m  Vitals:   05/28/24 0916  BP: (!) 156/72  Pulse: 67  Temp: 98.1 F (36.7 C)  SpO2: 98%    Immunization History  Administered Date(s) Administered   Fluad Quad(high Dose 65+) 03/26/2019   Fluad Trivalent(High Dose 65+) 03/10/2023   INFLUENZA, HIGH DOSE SEASONAL PF 04/23/2024   Influenza Inj Mdck Quad Pf 03/20/2018   Influenza,inj,Quad PF,6+ Mos 02/20/2017   Influenza-Unspecified 03/18/2020, 04/03/2022   PFIZER Comirnaty(Gray Top)Covid-19 Tri-Sucrose Vaccine 10/09/2020   PFIZER(Purple Top)SARS-COV-2 Vaccination 09/27/2019, 10/18/2019, 04/17/2020   PNEUMOCOCCAL CONJUGATE-20 11/10/2022   Pfizer(Comirnaty)Fall Seasonal Vaccine 12 years and older 05/17/2022, 03/31/2023, 04/23/2024   Pneumococcal Conjugate-13 03/13/2013   Pneumococcal Polysaccharide-23 07/22/2014   Td 11/10/2022   Zoster Recombinant(Shingrix) 10/16/2020, 01/15/2021    Health Maintenance Due  Topic Date Due   FOOT EXAM  11/10/2023   Doing ok   Taking care of himself   Lost some weight  Eating healthy    Prostate health Sees urology  Lab Results  Component Value Date   PSA1 0.9 10/03/2023   PSA1 1.2 09/16/2022   PSA1 1.1 08/23/2021   PSA 0.5 02/29/2016     Colon cancer screening  Colonoscopy 2017 with 10 y recall  Bone health   Falls-none  Fractures-none  Supplements mvi daily(pack)    Exercise  Going to the gym  Treadmill / push ups/machines   Mood    05/28/2024    9:24 AM 04/23/2024    8:47 AM 01/26/2024    2:49 PM 07/19/2023   10:21 AM 03/31/2023    8:45 AM  Depression screen PHQ 2/9  Decreased Interest 0 0 0 0 0  Down, Depressed, Hopeless 0 0 0 0 0   PHQ - 2 Score 0 0 0 0 0  Altered sleeping 0 0 0 0   Tired, decreased energy 0 0 0 0   Change in appetite 0 0 0 0   Feeling bad or failure about yourself  0 0 0 0   Trouble concentrating 0 0 0 0   Moving slowly or fidgety/restless 0 0 0 0   Suicidal thoughts 0 0 0 0   PHQ-9 Score 0 0  0  0    Difficult doing work/chores Not difficult at all Not difficult at all Not difficult at all Not difficult at all      Data saved with a previous flowsheet row definition   HTN bp is stable today  No cp or palpitations or headaches or edema  No side effects to medicines  BP Readings from Last 3 Encounters:  05/28/24 (!) 156/72  04/23/24 (!) 185/78  01/26/24 128/70     Lab Results  Component Value Date   NA 140 04/23/2024   K 4.9 04/23/2024   CO2 27 04/23/2024   GLUCOSE 152 (H) 04/23/2024   BUN 24 04/23/2024   CREATININE 1.10 04/23/2024   CALCIUM  10.0 04/23/2024   GFR 60.87 10/23/2023   EGFR 77 05/17/2022   GFRNONAA 70 09/23/2020   Out of amlodipine  for weeks / his mail order is not sending request  Losartan  100  mg daily   DM2 Lab Results  Component Value Date   HGBA1C 7.1 (A) 01/26/2024   HGBA1C 9.0 (H) 10/23/2023   HGBA1C 8.8 (A) 07/19/2023   Lab Results  Component Value Date   MICROALBUR <0.2 01/26/2024   Due for labs  Metformin  xr 500 am, 1000 pm Farxiga  10 mg daily  Glipizide  xl 10 mg daily   On statin and arb   Glucose levels -usually 90s-low 100s No lows  After work 110-115 Is eating well     Hyperlipidemia Lab Results  Component Value Date   CHOL 138 11/10/2022   HDL 32.90 (L) 11/10/2022   LDLCALC 51 08/12/2019   LDLDIRECT 70.0 11/10/2022   TRIG 215.0 (H) 11/10/2022   CHOLHDL 4 11/10/2022   Crestor  20 mg daily  Is out of it unfortunately  Cannot get mail order to send  Lab Results  Component Value Date   ALT 12 04/23/2024   AST 30 04/23/2024   ALKPHOS 73 11/10/2022   BILITOT 0.4 04/23/2024     HIV Stable Dovato   ID care  Lab  Results  Component Value Date   WBC 6.2 04/23/2024   HGB 14.1 04/23/2024   HCT 45.1 04/23/2024   MCV 69.3 (L) 04/23/2024   PLT 253 04/23/2024         Patient Active Problem List   Diagnosis Date Noted   Hyperkalemia 03/10/2023   Routine general medical examination at a health care facility 11/10/2022   Therapeutic drug monitoring 10/09/2020   Erectile dysfunction due to arterial insufficiency 07/30/2019   Hypogonadism in male 07/13/2019   Essential hypertension 01/22/2018   Human immunodeficiency virus (HIV) disease (HCC) 10/12/2017   Healthcare maintenance 10/12/2017   Erectile dysfunction 10/12/2017   History of hernia repair 08/26/2017   Type 2 diabetes mellitus without complication, without long-term current use of insulin (HCC) 08/26/2017   Hyperlipidemia associated with type 2 diabetes mellitus (HCC) 08/26/2017   Past Medical History:  Diagnosis Date   Diabetes mellitus without complication (HCC)    ED (erectile dysfunction)    HIV (human immunodeficiency virus infection) (HCC) 10/12/2017   Hyperlipidemia    Past Surgical History:  Procedure Laterality Date   HERNIA REPAIR     Social History   Tobacco Use   Smoking status: Never   Smokeless tobacco: Never  Vaping Use   Vaping status: Never Used  Substance Use Topics   Alcohol use: No   Drug use: No   Family History  Problem Relation Age of Onset   Diabetes Mother    Stomach cancer Sister    Pancreatic cancer Brother    Prostate cancer Neg Hx    Bladder Cancer Neg Hx    Kidney cancer Neg Hx    No Known Allergies Current Outpatient Medications on File Prior to Visit  Medication Sig Dispense Refill   dapagliflozin  propanediol (FARXIGA ) 10 MG TABS tablet Take 1 tablet (10 mg total) by mouth daily. 90 tablet 3   dolutegravir -lamiVUDine  (DOVATO ) 50-300 MG tablet Take 1 tablet by mouth daily. 30 tablet 11   glipiZIDE  (GLUCOTROL  XL) 10 MG 24 hr tablet TAKE 1 TABLET DAILY WITH   BREAKFAST 90 tablet 1    losartan  (COZAAR ) 100 MG tablet TAKE 1 TABLET DAILY 90 tablet 1   metFORMIN  (GLUCOPHAGE -XR) 500 MG 24 hr tablet TAKE 1 TABLET IN THE       MORNING AND 2 TABLETS IN   THE EVENING 270 tablet 1   tadalafil  (CIALIS ) 20 MG tablet  TAKE ONE TABLET 1 HOUR PRIOR TO INTERCOURSE 20 tablet 2   tadalafil  (CIALIS ) 5 MG tablet Take 1 tablet (5 mg total) by mouth daily. 30 tablet 3   No current facility-administered medications on file prior to visit.    Review of Systems  Constitutional:  Negative for activity change, appetite change, fatigue, fever and unexpected weight change.  HENT:  Negative for congestion, rhinorrhea, sore throat and trouble swallowing.   Eyes:  Negative for pain, redness, itching and visual disturbance.  Respiratory:  Negative for cough, chest tightness, shortness of breath and wheezing.   Cardiovascular:  Negative for chest pain and palpitations.  Gastrointestinal:  Negative for abdominal pain, blood in stool, constipation, diarrhea and nausea.  Endocrine: Negative for cold intolerance, heat intolerance, polydipsia and polyuria.  Genitourinary:  Negative for difficulty urinating, dysuria, frequency and urgency.  Musculoskeletal:  Negative for arthralgias, joint swelling and myalgias.  Skin:  Negative for pallor and rash.  Neurological:  Negative for dizziness, tremors, weakness, numbness and headaches.  Hematological:  Negative for adenopathy. Does not bruise/bleed easily.  Psychiatric/Behavioral:  Negative for decreased concentration and dysphoric mood. The patient is not nervous/anxious.        Objective:   Physical Exam Constitutional:      General: He is not in acute distress.    Appearance: Normal appearance. He is well-developed and normal weight. He is not ill-appearing or diaphoretic.  HENT:     Head: Normocephalic and atraumatic.     Right Ear: Tympanic membrane, ear canal and external ear normal.     Left Ear: Tympanic membrane, ear canal and external ear normal.      Nose: Nose normal. No congestion.     Mouth/Throat:     Mouth: Mucous membranes are moist.     Pharynx: Oropharynx is clear. No posterior oropharyngeal erythema.  Eyes:     General: No scleral icterus.       Right eye: No discharge.        Left eye: No discharge.     Conjunctiva/sclera: Conjunctivae normal.     Pupils: Pupils are equal, round, and reactive to light.  Neck:     Thyroid: No thyromegaly.     Vascular: No carotid bruit or JVD.  Cardiovascular:     Rate and Rhythm: Normal rate and regular rhythm.     Pulses: Normal pulses.     Heart sounds: Normal heart sounds.     No gallop.  Pulmonary:     Effort: Pulmonary effort is normal. No respiratory distress.     Breath sounds: Normal breath sounds. No wheezing or rales.     Comments: Good air exch Chest:     Chest wall: No tenderness.  Abdominal:     General: Bowel sounds are normal. There is no distension or abdominal bruit.     Palpations: Abdomen is soft. There is no mass.     Tenderness: There is no abdominal tenderness.     Hernia: No hernia is present.  Musculoskeletal:        General: No tenderness.     Cervical back: Normal range of motion and neck supple. No rigidity. No muscular tenderness.     Right lower leg: No edema.     Left lower leg: No edema.  Lymphadenopathy:     Cervical: No cervical adenopathy.  Skin:    General: Skin is warm and dry.     Coloration: Skin is not pale.     Findings: No erythema  or rash.     Comments: Few lentigines on back/trunk   Neurological:     Mental Status: He is alert.     Cranial Nerves: No cranial nerve deficit.     Motor: No abnormal muscle tone.     Coordination: Coordination normal.     Gait: Gait normal.     Deep Tendon Reflexes: Reflexes are normal and symmetric. Reflexes normal.  Psychiatric:        Mood and Affect: Mood normal.        Cognition and Memory: Cognition and memory normal.           Assessment & Plan:   Problem List Items Addressed  This Visit       Cardiovascular and Mediastinum   Essential hypertension   bp up after running out of medication  BP Readings from Last 1 Encounters:  05/28/24 (!) 156/72   No changes needed Most recent labs reviewed  Disc lifstyle change with low sodium diet and exercise -doing well with lifestyle change Taking losartan  100 mg daily  Mail order would/could not fill amlodipine ? Pt could not get in touch with him Sent 10 d to local and 90 day to mail order pharmacy Instructed to call us  in future if cannot get refil   Follow up 2-3 weeks for re check          Relevant Medications   amLODipine  (NORVASC ) 5 MG tablet   rosuvastatin  (CRESTOR ) 20 MG tablet   rosuvastatin  (CRESTOR ) 20 MG tablet   amLODipine  (NORVASC ) 5 MG tablet   Other Relevant Orders   Lipid panel   TSH     Endocrine   Type 2 diabetes mellitus without complication, without long-term current use of insulin (HCC)   A1c today   Metformin  xr 500 am and 1000 pm Farxiga  10 mg daily  Glipizide  xl 10 mg daily   Microalb utd   Eating better Good exercise Normal foot exam [facility] On statin and arb         Relevant Medications   rosuvastatin  (CRESTOR ) 20 MG tablet   rosuvastatin  (CRESTOR ) 20 MG tablet   Other Relevant Orders   Hemoglobin A1c   Hyperlipidemia associated with type 2 diabetes mellitus (HCC)   Disc goals for lipids and reasons to control them Rev last labs with pt Rev low sat fat diet in detail  Labs today  Will be higher after running out of crestor  (mail order issue)  Sent 10 d to local 90 to mail order   Crestor  20 mg daily       Relevant Medications   amLODipine  (NORVASC ) 5 MG tablet   rosuvastatin  (CRESTOR ) 20 MG tablet   rosuvastatin  (CRESTOR ) 20 MG tablet   amLODipine  (NORVASC ) 5 MG tablet   Other Relevant Orders   Hemoglobin A1c     Other   Routine general medical examination at a health care facility - Primary   Reviewed health habits including diet and  exercise and skin cancer prevention Reviewed appropriate screening tests for age  Also reviewed health mt list, fam hx and immunization status , as well as social and family history   See HPI Labs reviewed and ordered Health Maintenance  Topic Date Due   Complete foot exam   11/10/2023   Hemoglobin A1C  07/28/2024   COVID-19 Vaccine (8 - Pfizer risk 2025-26 season) 10/22/2024   Yearly kidney health urinalysis for diabetes  01/25/2025   Eye exam for diabetics  02/21/2025  Yearly kidney function blood test for diabetes  04/23/2025   Colon Cancer Screening  07/05/2025   DTaP/Tdap/Td vaccine (2 - Tdap) 11/09/2032   Pneumococcal Vaccine for age over 77  Completed   Flu Shot  Completed   Hepatitis C Screening  Completed   Zoster (Shingles) Vaccine  Completed   Meningitis B Vaccine  Aged Out    Discussed fall prevention, supplements and exercise for bone density  Commended good habits  PHQ 0

## 2024-05-28 NOTE — Assessment & Plan Note (Signed)
 bp up after running out of medication  BP Readings from Last 1 Encounters:  05/28/24 (!) 156/72   No changes needed Most recent labs reviewed  Disc lifstyle change with low sodium diet and exercise -doing well with lifestyle change Taking losartan  100 mg daily  Mail order would/could not fill amlodipine ? Pt could not get in touch with him Sent 10 d to local and 90 day to mail order pharmacy Instructed to call us  in future if cannot get refil   Follow up 2-3 weeks for re check

## 2024-05-28 NOTE — Assessment & Plan Note (Signed)
 Disc goals for lipids and reasons to control them Rev last labs with pt Rev low sat fat diet in detail  Labs today  Will be higher after running out of crestor  (mail order issue)  Sent 10 d to local 90 to mail order   Crestor  20 mg daily

## 2024-05-28 NOTE — Patient Instructions (Signed)
 Pick up a 10 day supply of blood pressure and cholesterol medicine at the local pharmacy    I sent the prescriptions to your mail order as well  Follow up 2-3 weeks after re starting your medication   Lab today    Keep eating well and keep exercising

## 2024-05-29 NOTE — Progress Notes (Signed)
 The 10-year ASCVD risk score (Arnett DK, et al., 2019) is: 51.3%   Values used to calculate the score:     Age: 68 years     Clincally relevant sex: Male     Is Non-Hispanic African American: Yes     Diabetic: Yes     Tobacco smoker: No     Systolic Blood Pressure: 156 mmHg     Is BP treated: Yes     HDL Cholesterol: 33.9 mg/dL     Total Cholesterol: 266 mg/dL  Currently prescribed rosuvastatin  20 mg.  Cheryel Kyte, BSN, RN

## 2024-06-06 ENCOUNTER — Encounter: Payer: Self-pay | Admitting: Urology

## 2024-06-14 ENCOUNTER — Other Ambulatory Visit: Payer: Self-pay

## 2024-06-18 ENCOUNTER — Other Ambulatory Visit: Payer: Self-pay

## 2024-06-18 ENCOUNTER — Ambulatory Visit: Admitting: Family Medicine

## 2024-06-18 NOTE — Progress Notes (Signed)
 Specialty Pharmacy Refill Coordination Note  Clinton Watts is a 68 y.o. male contacted today regarding refills of specialty medication(s) Dolutegravir -lamiVUDine  (Dovato )   Patient requested Delivery   Delivery date: 06/20/24   Verified address: [address] [place] [address]   Medication will be filled on: 06/19/24

## 2024-06-19 ENCOUNTER — Other Ambulatory Visit: Payer: Self-pay

## 2024-07-08 ENCOUNTER — Telehealth: Payer: Self-pay

## 2024-07-08 ENCOUNTER — Other Ambulatory Visit (HOSPITAL_COMMUNITY): Payer: Self-pay

## 2024-07-08 ENCOUNTER — Other Ambulatory Visit: Payer: Self-pay

## 2024-07-08 NOTE — Progress Notes (Signed)
 Specialty Pharmacy Refill Coordination Note  Clinton Watts is a 69 y.o. male contacted today regarding refills of specialty medication(s) Dolutegravir -lamiVUDine  (Dovato )   Patient requested Delivery   Delivery date: 07/12/24   Verified address: [address]  [place] [address]   Medication will be filled on: 07/11/24

## 2024-07-08 NOTE — Telephone Encounter (Signed)
 [facility] Patient Advocate Encounter   I was successful in securing patient a $5000 grant from [employer] ([employer]) to provide copayment coverage for $3910.  This will make the out of pocket cost $0.     I have spoken with the patient.    The billing information is as follows and has been shared with [name].   RxBin: [id number] PCN:  [id number] Member ID: [id number] Group ID: [id number] Dates of Eligibility: 01/10/24 through 07/08/25    Patient knows to call the office with questions or concerns.  Charmaine Sharps, CPhT Specialty Pharmacy Patient [facility] Phone: [phone] Fax:  [phone]

## 2024-07-08 NOTE — Progress Notes (Signed)
 Specialty Pharmacy Ongoing Clinical Assessment Note  Clinton Watts is a 69 y.o. male who is being followed by the specialty pharmacy service for RxSp HIV   Patient's specialty medication(s) reviewed today: Dolutegravir -lamiVUDine  (Dovato )   Missed doses in the last 4 weeks: 0   Patient/Caregiver did not have any additional questions or concerns.   Therapeutic benefit summary: Patient is achieving benefit   Adverse events/side effects summary: No adverse events/side effects   Patient's therapy is appropriate to: Continue    Goals Addressed             This Visit's Progress    Achieve Undetectable HIV Viral Load < 20   On track    Patient is on track. Patient will maintain adherence.  Patient's viral load remains undetectable last lab was 04/23/24.         Follow up: 12 months  Clinton Watts Specialty Pharmacist

## 2024-07-11 ENCOUNTER — Other Ambulatory Visit: Payer: Self-pay

## 2024-07-11 ENCOUNTER — Other Ambulatory Visit (HOSPITAL_COMMUNITY): Payer: Self-pay

## 2024-07-16 ENCOUNTER — Other Ambulatory Visit: Payer: Self-pay

## 2024-08-02 ENCOUNTER — Other Ambulatory Visit: Payer: Self-pay

## 2024-08-05 ENCOUNTER — Telehealth: Payer: Self-pay | Admitting: Family Medicine

## 2024-08-05 DIAGNOSIS — E119 Type 2 diabetes mellitus without complications: Secondary | ICD-10-CM

## 2024-08-05 DIAGNOSIS — I10 Essential (primary) hypertension: Secondary | ICD-10-CM

## 2024-08-05 MED ORDER — GLIPIZIDE ER 10 MG PO TB24: 10.0000 mg | ORAL_TABLET | Freq: Every day | ORAL | 1 refills | Status: AC

## 2024-08-05 MED ORDER — LOSARTAN POTASSIUM 100 MG PO TABS: 100.0000 mg | ORAL_TABLET | Freq: Every day | ORAL | 1 refills | Status: AC

## 2024-08-05 NOTE — Telephone Encounter (Signed)
 Copied from CRM #[id number]. Topic: Clinical - Medication Refill >> Aug 05, 2024  9:48 AM Taleah C wrote: Medication: Losartan  100 mg, Glipizide  10mg  extended release tablet  Has the patient contacted their pharmacy? Yes Pharmacy called  This is the patient's preferred pharmacy:  [facility] - [place], [place] - [facility] AT Portal to Registered [facility] [address] [place] [address] Phone: [phone] Fax: [phone]  Callback # (provider line) [id number], option 2// reference # [id number]  Is this the correct pharmacy for this prescription? Yes If no, delete pharmacy and type the correct one.   Has the prescription been filled recently? No  Is the patient out of the medication? No  Has the patient been seen for an appointment in the last year OR does the patient have an upcoming appointment? Yes  Can we respond through MyChart? No  Agent: Please be advised that Rx refills may take up to 3 business days. We ask that you follow-up with your pharmacy.

## 2024-08-06 ENCOUNTER — Other Ambulatory Visit (HOSPITAL_COMMUNITY): Payer: Self-pay

## 2024-08-08 ENCOUNTER — Other Ambulatory Visit: Payer: Self-pay

## 2024-10-07 ENCOUNTER — Other Ambulatory Visit

## 2024-10-10 ENCOUNTER — Ambulatory Visit: Admitting: Urology

## 2024-10-16 ENCOUNTER — Ambulatory Visit: Admitting: Urology
